# Patient Record
Sex: Female | Born: 1965 | Race: Black or African American | Hispanic: No | Smoking: Former smoker
Health system: Southern US, Community
[De-identification: ages and names within clinical notes are randomized; demographics above are authoritative.]

## PROBLEM LIST (undated history)

## (undated) DIAGNOSIS — M7989 Other specified soft tissue disorders: Secondary | ICD-10-CM

## (undated) DIAGNOSIS — C73 Malignant neoplasm of thyroid gland: Secondary | ICD-10-CM

## (undated) DIAGNOSIS — D649 Anemia, unspecified: Secondary | ICD-10-CM

## (undated) DIAGNOSIS — E079 Disorder of thyroid, unspecified: Secondary | ICD-10-CM

## (undated) DIAGNOSIS — N289 Disorder of kidney and ureter, unspecified: Secondary | ICD-10-CM

## (undated) DIAGNOSIS — Z9289 Personal history of other medical treatment: Secondary | ICD-10-CM

## (undated) DIAGNOSIS — I10 Essential (primary) hypertension: Secondary | ICD-10-CM

## (undated) DIAGNOSIS — K5790 Diverticulosis of intestine, part unspecified, without perforation or abscess without bleeding: Secondary | ICD-10-CM

## (undated) HISTORY — DX: Malignant neoplasm of thyroid gland: C73

## (undated) HISTORY — PX: COSMETIC SURGERY: SHX468

## (undated) HISTORY — DX: Disorder of thyroid, unspecified: E07.9

## (undated) HISTORY — DX: Personal history of other medical treatment: Z92.89

## (undated) HISTORY — DX: Diverticulosis of intestine, part unspecified, without perforation or abscess without bleeding: K57.90

## (undated) HISTORY — DX: Disorder of kidney and ureter, unspecified: N28.9

## (undated) HISTORY — DX: Anemia, unspecified: D64.9

## (undated) HISTORY — PX: REDUCTION MAMMAPLASTY: SUR839

---

## 1985-03-30 HISTORY — PX: CHOLECYSTECTOMY: SHX55

## 1997-03-30 HISTORY — PX: THYROIDECTOMY: SHX17

## 1997-12-28 ENCOUNTER — Emergency Department (HOSPITAL_COMMUNITY): Admission: EM | Admit: 1997-12-28 | Discharge: 1997-12-28 | Payer: Self-pay | Admitting: Emergency Medicine

## 1999-01-06 ENCOUNTER — Other Ambulatory Visit: Admission: RE | Admit: 1999-01-06 | Discharge: 1999-01-06 | Payer: Self-pay | Admitting: Endocrinology

## 1999-01-22 ENCOUNTER — Encounter: Payer: Self-pay | Admitting: Otolaryngology

## 1999-01-22 ENCOUNTER — Encounter: Admission: RE | Admit: 1999-01-22 | Discharge: 1999-01-22 | Payer: Self-pay | Admitting: Otolaryngology

## 1999-02-06 ENCOUNTER — Encounter (INDEPENDENT_AMBULATORY_CARE_PROVIDER_SITE_OTHER): Payer: Self-pay | Admitting: Specialist

## 1999-02-06 ENCOUNTER — Observation Stay (HOSPITAL_COMMUNITY): Admission: RE | Admit: 1999-02-06 | Discharge: 1999-02-07 | Payer: Self-pay | Admitting: Otolaryngology

## 1999-08-09 ENCOUNTER — Emergency Department (HOSPITAL_COMMUNITY): Admission: EM | Admit: 1999-08-09 | Discharge: 1999-08-09 | Payer: Self-pay | Admitting: Emergency Medicine

## 1999-08-09 ENCOUNTER — Encounter: Payer: Self-pay | Admitting: Emergency Medicine

## 2000-04-20 ENCOUNTER — Other Ambulatory Visit: Admission: RE | Admit: 2000-04-20 | Discharge: 2000-04-20 | Payer: Self-pay | Admitting: Obstetrics

## 2000-04-21 ENCOUNTER — Ambulatory Visit (HOSPITAL_COMMUNITY): Admission: RE | Admit: 2000-04-21 | Discharge: 2000-04-21 | Payer: Self-pay | Admitting: Obstetrics

## 2000-04-21 ENCOUNTER — Encounter: Payer: Self-pay | Admitting: Obstetrics

## 2000-08-07 ENCOUNTER — Inpatient Hospital Stay (HOSPITAL_COMMUNITY): Admission: AD | Admit: 2000-08-07 | Discharge: 2000-08-07 | Payer: Self-pay | Admitting: *Deleted

## 2000-08-08 ENCOUNTER — Inpatient Hospital Stay (HOSPITAL_COMMUNITY): Admission: AD | Admit: 2000-08-08 | Discharge: 2000-08-08 | Payer: Self-pay | Admitting: Obstetrics

## 2000-08-09 ENCOUNTER — Inpatient Hospital Stay (HOSPITAL_COMMUNITY): Admission: AD | Admit: 2000-08-09 | Discharge: 2000-08-12 | Payer: Self-pay | Admitting: Obstetrics

## 2000-08-09 ENCOUNTER — Encounter (INDEPENDENT_AMBULATORY_CARE_PROVIDER_SITE_OTHER): Payer: Self-pay | Admitting: Specialist

## 2004-01-02 ENCOUNTER — Ambulatory Visit (HOSPITAL_COMMUNITY): Admission: RE | Admit: 2004-01-02 | Discharge: 2004-01-02 | Payer: Self-pay | Admitting: Obstetrics

## 2004-07-24 ENCOUNTER — Emergency Department (HOSPITAL_COMMUNITY): Admission: EM | Admit: 2004-07-24 | Discharge: 2004-07-24 | Payer: Self-pay | Admitting: Family Medicine

## 2004-07-25 ENCOUNTER — Encounter: Admission: RE | Admit: 2004-07-25 | Discharge: 2004-07-25 | Payer: Self-pay | Admitting: Internal Medicine

## 2004-07-25 ENCOUNTER — Ambulatory Visit: Payer: Self-pay | Admitting: Internal Medicine

## 2004-08-01 ENCOUNTER — Ambulatory Visit: Payer: Self-pay | Admitting: Internal Medicine

## 2004-08-22 ENCOUNTER — Ambulatory Visit: Payer: Self-pay | Admitting: Internal Medicine

## 2004-08-29 ENCOUNTER — Ambulatory Visit: Payer: Self-pay | Admitting: Internal Medicine

## 2004-09-15 ENCOUNTER — Ambulatory Visit: Payer: Self-pay | Admitting: Internal Medicine

## 2004-09-24 ENCOUNTER — Ambulatory Visit: Payer: Self-pay | Admitting: Internal Medicine

## 2004-10-09 ENCOUNTER — Ambulatory Visit: Payer: Self-pay | Admitting: Internal Medicine

## 2004-12-16 ENCOUNTER — Ambulatory Visit: Payer: Self-pay | Admitting: Internal Medicine

## 2004-12-19 ENCOUNTER — Encounter: Admission: RE | Admit: 2004-12-19 | Discharge: 2004-12-19 | Payer: Self-pay | Admitting: Otolaryngology

## 2005-03-25 ENCOUNTER — Emergency Department (HOSPITAL_COMMUNITY): Admission: EM | Admit: 2005-03-25 | Discharge: 2005-03-25 | Payer: Self-pay | Admitting: Family Medicine

## 2006-12-17 DIAGNOSIS — Z8719 Personal history of other diseases of the digestive system: Secondary | ICD-10-CM | POA: Insufficient documentation

## 2007-05-06 ENCOUNTER — Ambulatory Visit: Payer: Self-pay | Admitting: Internal Medicine

## 2007-05-06 ENCOUNTER — Telehealth: Payer: Self-pay | Admitting: Internal Medicine

## 2007-05-06 DIAGNOSIS — R42 Dizziness and giddiness: Secondary | ICD-10-CM

## 2007-05-06 DIAGNOSIS — N920 Excessive and frequent menstruation with regular cycle: Secondary | ICD-10-CM

## 2007-05-06 DIAGNOSIS — D649 Anemia, unspecified: Secondary | ICD-10-CM

## 2007-05-06 LAB — CONVERTED CEMR LAB: Hemoglobin: 9.9 g/dL

## 2007-05-23 ENCOUNTER — Ambulatory Visit (HOSPITAL_COMMUNITY): Admission: RE | Admit: 2007-05-23 | Discharge: 2007-05-23 | Payer: Self-pay | Admitting: Obstetrics

## 2007-05-23 ENCOUNTER — Telehealth: Payer: Self-pay | Admitting: Internal Medicine

## 2007-06-17 ENCOUNTER — Ambulatory Visit: Payer: Self-pay | Admitting: Internal Medicine

## 2007-06-17 LAB — CONVERTED CEMR LAB
Bilirubin Urine: NEGATIVE
Glucose, Urine, Semiquant: NEGATIVE
WBC Urine, dipstick: NEGATIVE

## 2007-06-20 LAB — CONVERTED CEMR LAB
ALT: 17 units/L (ref 0–35)
BUN: 8 mg/dL (ref 6–23)
Basophils Relative: 0.6 % (ref 0.0–1.0)
Bilirubin, Direct: 0.1 mg/dL (ref 0.0–0.3)
Chloride: 103 meq/L (ref 96–112)
Eosinophils Absolute: 0.1 10*3/uL (ref 0.0–0.6)
GFR calc Af Amer: 117 mL/min
HCT: 36.1 % (ref 36.0–46.0)
Hemoglobin: 11.6 g/dL — ABNORMAL LOW (ref 12.0–15.0)
LDL Cholesterol: 116 mg/dL — ABNORMAL HIGH (ref 0–99)
MCHC: 32.4 g/dL (ref 30.0–36.0)
Monocytes Absolute: 0.5 10*3/uL (ref 0.2–0.7)
Neutro Abs: 3.8 10*3/uL (ref 1.4–7.7)
Neutrophils Relative %: 67.7 % (ref 43.0–77.0)
Platelets: 321 10*3/uL (ref 150–400)
Potassium: 4.6 meq/L (ref 3.5–5.1)
RBC: 4.46 M/uL (ref 3.87–5.11)
RDW: 21.3 % — ABNORMAL HIGH (ref 11.5–14.6)
Total CHOL/HDL Ratio: 3.2
Triglycerides: 27 mg/dL (ref 0–149)

## 2007-06-24 ENCOUNTER — Ambulatory Visit: Payer: Self-pay | Admitting: Internal Medicine

## 2007-06-24 DIAGNOSIS — M5430 Sciatica, unspecified side: Secondary | ICD-10-CM

## 2007-08-23 ENCOUNTER — Ambulatory Visit: Payer: Self-pay | Admitting: Internal Medicine

## 2007-09-16 ENCOUNTER — Telehealth: Payer: Self-pay | Admitting: Internal Medicine

## 2007-11-18 ENCOUNTER — Telehealth: Payer: Self-pay | Admitting: Internal Medicine

## 2009-03-30 HISTORY — PX: OTHER SURGICAL HISTORY: SHX169

## 2009-07-09 ENCOUNTER — Ambulatory Visit: Payer: Self-pay | Admitting: Internal Medicine

## 2009-09-05 ENCOUNTER — Encounter (INDEPENDENT_AMBULATORY_CARE_PROVIDER_SITE_OTHER): Payer: Self-pay | Admitting: *Deleted

## 2009-09-13 ENCOUNTER — Emergency Department (HOSPITAL_BASED_OUTPATIENT_CLINIC_OR_DEPARTMENT_OTHER): Admission: EM | Admit: 2009-09-13 | Discharge: 2009-09-14 | Payer: Self-pay | Admitting: Emergency Medicine

## 2009-09-14 ENCOUNTER — Ambulatory Visit: Payer: Self-pay | Admitting: Radiology

## 2009-09-16 ENCOUNTER — Ambulatory Visit: Payer: Self-pay | Admitting: Internal Medicine

## 2009-09-16 DIAGNOSIS — R9389 Abnormal findings on diagnostic imaging of other specified body structures: Secondary | ICD-10-CM | POA: Insufficient documentation

## 2009-09-16 DIAGNOSIS — K5730 Diverticulosis of large intestine without perforation or abscess without bleeding: Secondary | ICD-10-CM | POA: Insufficient documentation

## 2009-09-16 DIAGNOSIS — R319 Hematuria, unspecified: Secondary | ICD-10-CM | POA: Insufficient documentation

## 2009-09-16 LAB — CONVERTED CEMR LAB
Ketones, urine, test strip: NEGATIVE
Specific Gravity, Urine: 1.01
Urobilinogen, UA: 0.2
WBC Urine, dipstick: NEGATIVE

## 2009-09-19 ENCOUNTER — Encounter: Admission: RE | Admit: 2009-09-19 | Discharge: 2009-09-19 | Payer: Self-pay | Admitting: Internal Medicine

## 2009-09-23 ENCOUNTER — Telehealth: Payer: Self-pay | Admitting: Internal Medicine

## 2009-09-25 ENCOUNTER — Ambulatory Visit: Payer: Self-pay | Admitting: Internal Medicine

## 2009-09-25 LAB — CONVERTED CEMR LAB
Glucose, Urine, Semiquant: NEGATIVE
Specific Gravity, Urine: >=1.03
pH: 5

## 2009-09-27 LAB — CONVERTED CEMR LAB
ALT: 21 units/L (ref 0–35)
Albumin: 4.1 g/dL (ref 3.5–5.2)
Alkaline Phosphatase: 57 units/L (ref 39–117)
Basophils Absolute: 0 10*3/uL (ref 0.0–0.1)
Calcium: 9.5 mg/dL (ref 8.4–10.5)
Cholesterol: 175 mg/dL (ref 0–200)
Creatinine, Ser: 0.7 mg/dL (ref 0.4–1.2)
Eosinophils Absolute: 0.2 10*3/uL (ref 0.0–0.7)
Glucose, Bld: 88 mg/dL (ref 70–99)
MCHC: 32.3 g/dL (ref 30.0–36.0)
Potassium: 5.1 meq/L (ref 3.5–5.1)
RBC: 4.2 M/uL (ref 3.87–5.11)
RDW: 20.9 % — ABNORMAL HIGH (ref 11.5–14.6)
Sodium: 142 meq/L (ref 135–145)
Total Bilirubin: 0.4 mg/dL (ref 0.3–1.2)
Total CHOL/HDL Ratio: 4
Triglycerides: 52 mg/dL (ref 0.0–149.0)
VLDL: 10.4 mg/dL (ref 0.0–40.0)

## 2009-10-08 ENCOUNTER — Ambulatory Visit: Payer: Self-pay | Admitting: Internal Medicine

## 2009-10-08 ENCOUNTER — Other Ambulatory Visit: Admission: RE | Admit: 2009-10-08 | Discharge: 2009-10-08 | Payer: Self-pay | Admitting: Internal Medicine

## 2009-10-08 DIAGNOSIS — E669 Obesity, unspecified: Secondary | ICD-10-CM

## 2009-10-08 LAB — CONVERTED CEMR LAB: Hemoglobin: 10.5 g/dL

## 2009-10-18 ENCOUNTER — Encounter: Payer: Self-pay | Admitting: Internal Medicine

## 2009-11-05 ENCOUNTER — Encounter: Payer: Self-pay | Admitting: Internal Medicine

## 2009-11-05 ENCOUNTER — Ambulatory Visit: Payer: Self-pay | Admitting: Family Medicine

## 2009-11-05 ENCOUNTER — Ambulatory Visit (HOSPITAL_COMMUNITY)
Admission: RE | Admit: 2009-11-05 | Discharge: 2009-11-05 | Payer: Self-pay | Source: Home / Self Care | Admitting: Urology

## 2009-11-05 DIAGNOSIS — M7710 Lateral epicondylitis, unspecified elbow: Secondary | ICD-10-CM | POA: Insufficient documentation

## 2009-11-20 ENCOUNTER — Ambulatory Visit: Payer: Self-pay | Admitting: Internal Medicine

## 2009-12-12 ENCOUNTER — Inpatient Hospital Stay (HOSPITAL_COMMUNITY)
Admission: RE | Admit: 2009-12-12 | Discharge: 2009-12-14 | Payer: Self-pay | Source: Home / Self Care | Admitting: Urology

## 2009-12-12 ENCOUNTER — Encounter (INDEPENDENT_AMBULATORY_CARE_PROVIDER_SITE_OTHER): Payer: Self-pay | Admitting: Urology

## 2010-01-07 ENCOUNTER — Encounter: Payer: Self-pay | Admitting: Internal Medicine

## 2010-02-25 ENCOUNTER — Ambulatory Visit: Payer: Self-pay | Admitting: Internal Medicine

## 2010-02-25 LAB — CONVERTED CEMR LAB: Hemoglobin: 11.1 g/dL

## 2010-03-30 HISTORY — PX: OTHER SURGICAL HISTORY: SHX169

## 2010-03-30 HISTORY — PX: BREAST REDUCTION SURGERY: SHX8

## 2010-04-19 ENCOUNTER — Encounter: Payer: Self-pay | Admitting: Internal Medicine

## 2010-04-29 NOTE — Letter (Signed)
Summary: [facility]  [facility]   Imported By: Maryln Gottron 01/14/2010 12:44:52  _____________________________________________________________________  External Attachment:    Type:   Image     Comment:   External Document

## 2010-04-29 NOTE — Letter (Signed)
Summary: Colonoscopy Date Change Letter  [facility]  [address] [place], [place] [address]   Phone: [phone]  Fax: [phone]      September 05, 2009 MRN: [id number]   [facility] [address] [place], [place]  [address]   Dear Ms. Misty Moran,   Previously you were recommended to have a repeat colonoscopy around this time. Your chart was recently reviewed by Dr.PERRY of [facility]. Follow up colonoscopy is now recommended in JUNE,2016. This revised recommendation is based on current, nationally recognized guidelines for colorectal cancer screening and polyp surveillance. These guidelines are endorsed by the American Cancer Society, [employer] on Colorectal Cancer as well as numerous other major medical organizations.  Please understand that our recommendation assumes that you do not have any new symptoms such as bleeding, a change in bowel habits, anemia, or significant abdominal discomfort. If you do have any concerning GI symptoms or want to discuss the guideline recommendations, please call to arrange an office visit at your earliest convenience. Otherwise we will keep you in our reminder system and contact you 1-2 months prior to the date listed above to schedule your next colonoscopy.  Thank you,  Misty Moran. Marina Goodell, M.D.  [facility] [phone]

## 2010-04-29 NOTE — Assessment & Plan Note (Signed)
Summary: R ARM PAIN // RS   Vital Signs:  Patient profile:   45 year old female Menstrual status:  regular Height:      66.5 inches (168.91 cm) Weight:      236 pounds (107.27 kg) O2 Sat:      97 % on Room air Temp:     98.5 degrees F (36.94 degrees C) oral Pulse rate:   100 / minute BP sitting:   120 / 88  (left arm) Cuff size:   large  Vitals Entered By: Josph Macho RMA (November 05, 2009 3:23 PM)  O2 Flow:  Room air CC: Right arm pain in elbow X2 weeks/ pt states she would like a referral/ CF Is Patient Diabetic? No   History of Present Illness: Patient in today for right elbow pain. The pain started about 2 weeks ago after she lifted a heavy suitcase. She has never had a similar injury. Heavy lifting and internal and external rotation at elbow make it worse. She felt as if it was swollen/red and hot earlier in the week. The redness and heat have resolved but it still feels somewhat swollen. the radiate slightly down the arm but not all the way to the hand. No numbness/tingling or weakness in the hand. She does work on [employer] but has not had any recent difficulties at work. With further questioning she has noted some mild stiffness in her neck on the left side recently. No HA/f/c/CP/palp/GI c/o.  Current Medications (verified): 1)  Multivitamins   Tabs (Multiple Vitamin) .... Once Daily 2)  Testosterone Implant  Allergies (verified): 1)  ! Ciprofloxacin Hcl (Ciprofloxacin Hcl) 2)  ! * Peanuts  Past History:  Past medical history reviewed for relevance to current acute and chronic problems. Social history (including risk factors) reviewed for relevance to current acute and chronic problems.  Past Medical History: Reviewed history from 10/08/2009 and no changes required. Diverticulitis, hx of   5-6 years ago. G3P2 Anemia    ? from periods on iron a long time.   lesion on kidney MRI  right BOsniak type 3  under eval  Social History: Reviewed history from 10/08/2009  and no changes required. Divorced  child at home hh of 3  Former Smoker Alcohol use-yes rare Drug use-no Regular exercise-yes  Review of Systems      See HPI  Physical Exam  General:  Well-developed,well-nourished,in no acute distress; alert,appropriate and cooperative throughout examination Head:  Normocephalic and atraumatic without obvious abnormalities. No apparent alopecia or balding. Neck:  No deformities, masses, or tenderness noted. Lungs:  Normal respiratory effort, chest expands symmetrically. Lungs are clear to auscultation, no crackles or wheezes. Heart:  Normal rate and regular rhythm. S1 and S2 normal without gallop, murmur, click, rub or other extra sounds. Abdomen:  Bowel sounds positive,abdomen soft and non-tender without masses, organomegaly or hernias noted. Extremities:  No clubbing, cyanosis, or deformity noted. slight swelling noted over lateral epicondyl, no warmth or erythema. mild tender with palpation and int/ext rotation. Neurologic:  No cranial nerve deficits noted. Station and gait are normal. Plantar reflexes are down-going bilaterally. DTRs are symmetrical throughout. Sensory, motor and coordinative functions appear intact. Psych:  Cognition and judgment appear intact. Alert and cooperative with normal attention span and concentration. No apparent delusions, illusions, hallucinations   Impression & Recommendations:  Problem # 1:  LATERAL EPICONDYLITIS, RIGHT (ICD-726.32) Apply ice two times a day and take Naproxen 375mg  by mouth two times a day as needed pain with  food.   Complete Medication List: 1)  Multivitamins Tabs (Multiple vitamin) .... Once daily 2)  Testosterone Implant  3)  Naproxen 375 Mg Tabs (Naproxen) .Marland Kitchen.. 1 tab by mouth two times a day as needed pain with food  Patient Instructions: 1)  Please schedule a follow-up appointment as needed if symptoms worsen or do not improve. 2)  Use Naproxen and ice to the left elbow two times a day if  inadequate relief may use tylenol for breakthrough pain 3)  Take 650 - 1000 mg of tylenol every 4-6 hours as needed for relief of pain or comfort of fever. Avoid taking more than 3000 mg in a 24 hour period( can cause liver damage in higher doses).  Prescriptions: NAPROXEN 375 MG TABS (NAPROXEN) 1 tab by mouth two times a day as needed pain with food  #60 x 1   Entered and Authorized by:   Danise Edge MD   Signed by:   Danise Edge MD on 11/05/2009   Method used:   Electronically to        [facility] [address] [id number] (retail)       [address]       [place], [place]  [address]       Ph: [phone]       Fax: [phone]   RxID:   [id number]

## 2010-04-29 NOTE — Assessment & Plan Note (Signed)
Summary: cracked toe/?inf/cjr   Vital Signs:  Patient profile:   45 year old female Menstrual status:  regular LMP:     06/17/2009 Height:      66.5 inches Weight:      241 pounds BMI:     38.45 Temp:     98.9 degrees F oral Pulse rate:   72 / minute BP sitting:   140 / 84  (left arm) Cuff size:   large  Vitals Entered By: Romualdo Bolk, CMA Duncan Dull) (July 09, 2009 9:48 AM) CC: Hit Ring toe on a door on 07/08/09. Nail is split down the middle. Pt is in pain when she thinks about it and looks at it. She is more concerned about infection and losing the nail. LMP (date): 06/17/2009 LMP - Character: heavy Menarche (age onset years): 7   Menses interval (days): 28 Menstrual flow (days): 7 Menstrual Status regular Enter LMP: 06/17/2009   History of Present Illness: Misty Moran comes in today for aboe when toe right 4th hit screen door . Pain when touches but not otherwise. Unsure what to  do. Supposed to  fo on a trip with lots of walking and asks what to do.  No weeping or previous injury.   Preventive Screening-Counseling & Management  Alcohol-Tobacco     Alcohol drinks/day: <1     Alcohol type: mixed drink     Smoking Status: quit     Year Quit: 1991  Caffeine-Diet-Exercise     Does Patient Exercise: yes     Type of exercise: T-tapp     Times/week: 2  Current Medications (verified): 1)  Multivitamins   Tabs (Multiple Vitamin) .... Once Daily 2)  Tandem Plus 162-115.2-1 Mg  Caps (Fefum-Fepo-Fa-B Cmp-C-Zn-Mn-Cu) .Marland Kitchen.. 1 By Mouth Once Daily  Allergies (verified): 1)  ! Ciprofloxacin Hcl (Ciprofloxacin Hcl) 2)  ! * Peanuts  Past History:  Past medical, surgical, family and social histories (including risk factors) reviewed for relevance to current acute and chronic problems.  Past Medical History: Reviewed history from 06/24/2007 and no changes required. Diverticulitis, hx of G3P2  Past Surgical History: Reviewed history from 06/24/2007 and no changes  required. CB C-setion G3 P2 Cholecystectomy Thyroidectomy  thyroid ca Colonoscopy 6/06  Past History:  Care Management: None Current  Family History: Reviewed history from 06/24/2007 and no changes required. cva gps ht gm dm gr aunt Mom early chf smoker    Social History: Reviewed history from 08/23/2007 and no changes required. Divorced  child at home Former Smoker Alcohol use-yes rare Drug use-no Regular exercise-yes  Physical Exam  General:  Well-developed,well-nourished,in no acute distress; alert,appropriate and cooperative throughout examination Msk:  no joint swelling and no joint warmth.  toenails painted  right 4th toes with irreg split nail but not elevated and small amt of blood below nail  no tenderness  on palpation. oozing or swelling or joint.   rest of foot nl except bandaid on pinky toe. Pulses:  pulses intact without delay   Extremities:  no clubbing cyanosis or edema  Psych:  Oriented X3, good eye contact, not anxious appearing, and not depressed appearing.     Impression & Recommendations:  Problem # 1:  TOE INJURY (ICD-959.7) Assessment New involves nail but no nailbed ? involvement and seems intact and no infection or joint involvement.  local care and observation for infection . etc.   Complete Medication List: 1)  Multivitamins Tabs (Multiple vitamin) .... Once daily 2)  Tandem Plus 162-115.2-1 Mg Caps (  Fefum-fepo-fa-b cmp-c-zn-mn-cu) .Marland Kitchen.. 1 by mouth once daily  Patient Instructions: 1)  soak toe warm water 3 x per day and then antibiotic ointment  2)  Keep coved to prevent further trauma   . 3)  will take a while for the nail to grow out.   4)  Call if get signs of infection

## 2010-04-29 NOTE — Assessment & Plan Note (Signed)
Summary: DISCUSS MEDS / RS   Vital Signs:  Patient profile:   45 year old female Menstrual status:  regular LMP:     02/08/2010 Weight:      230 pounds Pulse rate:   72 / minute BP sitting:   130 / 70  (right arm) Cuff size:   large  Vitals Entered By: Romualdo Bolk, CMA Duncan Dull) (February 25, 2010 2:06 PM) CC: Discuss medications- Pt wants to go on a weight loss drug like phenteramine LMP (date): 02/08/2010 LMP - Character: heavy Menarche (age onset years): 7   Menses interval (days): 28 Menstrual flow (days): 7 Enter LMP: 02/08/2010 Last PAP Result NEGATIVE FOR INTRAEPITHELIAL LESIONS OR MALIGNANCY.   History of Present Illness: Misty Moran comes in today   for a couple of issiues . Since her last visit she had  #Surgery   on kidney Sept 15 and  fortunately was benign . #"Iron levels" were normal   still on iron pill 1 point below   .    #Still taking homone levels  testosterone and progesterone  per implant and levels  .  Marland Kitchen  #HAd lost weight before hospitalization and then went on a  . cruise and   and had extra calories   and gained weight back .   She  is a stress eater  and then gains weight back.  cutting.  out  much recently the describes having difficulty getting her weight down. She requests diet pills. She is aware that that is not a solution as she has lost weight before. She denies chest pain shortness of breath hypertension.      Preventive Screening-Counseling & Management  Alcohol-Tobacco     Alcohol drinks/day: <1     Alcohol type: mixed drink     Smoking Status: quit     Year Quit: 1991  Caffeine-Diet-Exercise     Caffeine use/day: 1     Does Patient Exercise: yes     Type of exercise: T-tapp     Times/week: 2  Current Medications (verified): 1)  Multivitamins   Tabs (Multiple Vitamin) .... Once Daily 2)  Testosterone Implant 3)  Naproxen 375 Mg Tabs (Naproxen) .Marland Kitchen.. 1 Tab By Mouth Two Times A Day As Needed Pain With Food 4)  Progesterone   Powd (Progesterone) .... 50mg  A Day  Allergies (verified): 1)  ! Ciprofloxacin Hcl (Ciprofloxacin Hcl) 2)  ! * Peanuts  Past History:  Past medical, surgical, family and social histories (including risk factors) reviewed, and no changes noted (except as noted below).  Past Medical History: Reviewed history from 11/20/2009 and no changes required. Diverticulitis, hx of   5-6 years ago. G3P2 Anemia    ? from periods on iron a long time.   lesion on kidney MRI  right BOsniak type 3  under eval to have sugery to remove   Past Surgical History: CB C-section G3 P2  Cholecystectomy Thyroidectomy  thyroid ca Colonoscopy 6/06 removal of renal lesion benign 2011  Past History:  Care Management: GI :  Marina Goodell Gynecology: Gaynell Face.  [facility]  [address]   phone [phone] hormone replacement Urology :  Family History: Reviewed history from 06/24/2007 and no changes required. cva gps ht gm dm gr aunt Mom early chf smoker    Social History: Reviewed history from 10/08/2009 and no changes required. Divorced  child at home hh of 3  Former Smoker Alcohol use-yes rare Drug use-no Regular exercise-yes  Review of Systems  The  patient denies anorexia, fever, weight loss, vision loss, decreased hearing, chest pain, syncope, prolonged cough, abdominal pain, melena, hematochezia, severe indigestion/heartburn, transient blindness, difficulty walking, depression, abnormal bleeding, enlarged lymph nodes, angioedema, and breast masses.    Physical Exam  General:  Well-developed,well-nourished,in no acute distress; alert,appropriate and cooperative throughout examination Head:  normocephalic and atraumatic.   Psych:  Oriented X3, memory intact for recent and remote, good eye contact, not anxious appearing, and not depressed appearing.     Impression & Recommendations:  Problem # 1:  OBESITY (ICD-278.00)  most recently she has had surgery holiday and a cruise which of affected her  weight gain. These would best be addressed with lifestyle intervention.  we had a very lengthy discussion about use of anorectic medications which I don't usually use because they are only indicated were short term and do not encourage lifestyle change. I've seen more patients depressed in rebounding having used these medications. However she's aware of all these limitations.  She has no contraindication to this medicine. However I discussed that she would need to do more lifestyle changes such as not eating out and come back in a month which he agreed to. Ht: 66.5 (11/05/2009)   Wt: 230 (02/25/2010)   BMI: 38.45 (07/09/2009)  Problem # 2:  NONSPECIFIC ABN FINDING RAD & OTH EXAM GU ORGAN (ICD-793.5) removal showed benign lesion  Problem # 3:  UNSPECIFIED ANEMIA (ICD-285.9) Assessment: Improved improved  was 10.2 in JUne  Orders: Hgb (85018) Fingerstick [phone])  Hgb: 11.1 (02/25/2010)   Hct: 29.8 (09/25/2009)   Platelets: 354.0 (09/25/2009) RBC: 4.20 (09/25/2009)   RDW: 20.9 (09/25/2009)   WBC: 6.4 (09/25/2009) MCV: 70.8 (09/25/2009)   MCHC: 32.3 (09/25/2009) TSH: 1.40 (09/25/2009)  Complete Medication List: 1)  Multivitamins Tabs (Multiple vitamin) .... Once daily 2)  Testosterone Implant  3)  Naproxen 375 Mg Tabs (Naproxen) .Marland Kitchen.. 1 tab by mouth two times a day as needed pain with food 4)  Progesterone Powd (Progesterone) .... 50mg  a day 5)  Phentermine Hcl 37.5 Mg Caps (Phentermine hcl) .Marland Kitchen.. 1 by mouth once daily  Patient Instructions: 1)  No eating out for a  month . 2)  record   intake and exercise .  for at least 2 weeks .   3)  You need to use up 3500 calories more than intake to lose one pound of body weight.  4)  return office visit in a month or as needed.  Prescriptions: PHENTERMINE HCL 37.5 MG CAPS (PHENTERMINE HCL) 1 by mouth once daily  #30 x 0   Entered and Authorized by:   Madelin Headings MD   Signed by:   Madelin Headings MD on 02/25/2010   Method used:   Print then Give  to Patient   RxID:   [id number]    Orders Added: 1)  Hgb [85018] 2)  Fingerstick [36416] 3)  Est. Patient Level IV [99214]    Laboratory Results   CBC   HGB:  11.1 g/dL   (Normal Range: 95.6-21.3 in Males, 12.0-15.0 in Females) Comments: Rita Ohara  February 25, 2010 2:54 PM

## 2010-04-29 NOTE — Progress Notes (Signed)
Summary: URO referral  Phone Note Call from Patient   Caller: Patient Call For: Madelin Headings MD Summary of Call: Pt is calling about URO referral.  Please call. [phone] Initial call taken by: Lynann Beaver CMA,  September 23, 2009 1:41 PM  Follow-up for Phone Call        Faxed order to [facility] on 6/27 - they will contact pt directly to set up appt.   I called and left message for pt. Follow-up by: Corky Mull,  September 23, 2009 1:45 PM

## 2010-04-29 NOTE — Assessment & Plan Note (Signed)
Summary: cpx/pap/njr   Vital Signs:  Patient profile:   45 year old female Menstrual status:  regular LMP:     10/02/2009 Height:      66.5 inches Weight:      237 pounds Pulse rate:   88 / minute BP sitting:   120 / 80  (right arm) Cuff size:   large  Vitals Entered By: Romualdo Bolk, CMA (AAMA) (October 08, 2009 1:19 PM) CC: cpx with pap LMP (date): 10/02/2009 LMP - Character: heavy Menarche (age onset years): 7   Menses interval (days): 28 Menstrual flow (days): 7 Enter LMP: 10/02/2009   History of Present Illness: Misty Moran comes in today  for preventive visit  Since last visit  here  there have been no major changes in health status    testosterone.  implants through clinic in town for fatigue . / if helping. HAs appt with urology in a week or so about the lesion on the MRI . Feels well but somewhat tired at times. No unusual bleeding   currently .    Preventive Care Screening  Mammogram:    Date:  03/31/2007    Results:  normal    Preventive Screening-Counseling & Management  Alcohol-Tobacco     Alcohol drinks/day: <1     Alcohol type: mixed drink     Smoking Status: quit     Year Quit: 1991  Caffeine-Diet-Exercise     Caffeine use/day: 1     Does Patient Exercise: yes     Type of exercise: T-tapp     Times/week: 2  Hep-HIV-STD-Contraception     Dental Visit-last 6 months no     Dental Care Counseling: to seek dental care; no dental care within six months  Safety-Violence-Falls     Seat Belt Use: yes     Firearms in the Home: no firearms in the home     Smoke Detectors: yes      Blood Transfusions:  no.    Current Medications (verified): 1)  Multivitamins   Tabs (Multiple Vitamin) .... Once Daily 2)  Testosterone Implant 3)  Zofran Odt 8 Mg Tbdp (Ondansetron) 4)  Dilaudid 4 Mg Tabs (Hydromorphone Hcl)  Allergies (verified): 1)  ! Ciprofloxacin Hcl (Ciprofloxacin Hcl) 2)  ! * Peanuts  Past History:  Past medical, surgical,  family and social histories (including risk factors) reviewed, and no changes noted (except as noted below).  Past Medical History: Diverticulitis, hx of   5-6 years ago. G3P2 Anemia    ? from periods on iron a long time.   lesion on kidney MRI  right BOsniak type 3  under eval  Past Surgical History: Reviewed history from 09/16/2009 and no changes required. CB C-section G3 P2  Cholecystectomy Thyroidectomy  thyroid ca Colonoscopy 6/06  Past History:  Care Management: None Current GI :  Marina Goodell Gynecology: Gaynell Face.  [facility]  [address]   phone [phone] hormone replacement  Family History: Reviewed history from 06/24/2007 and no changes required. cva gps ht gm dm gr aunt Mom early chf smoker    Social History: Reviewed history from 08/23/2007 and no changes required. Divorced  child at home hh of 3  Former Smoker Alcohol use-yes rare Drug use-no Regular Research scientist (life sciences) Use:  yes Dental Care w/in 6 mos.:  no Blood Transfusions:  no  Review of Systems  The patient denies anorexia, fever, weight loss, weight gain, vision loss, decreased hearing, hoarseness, chest pain, syncope, dyspnea on exertion, peripheral  edema, prolonged cough, headaches, hemoptysis, abdominal pain, melena, hematochezia, severe indigestion/heartburn, incontinence, difficulty walking, enlarged lymph nodes, and angioedema.          Periods  5 - 5.5 days heavy.    Physical Exam General Appearance: well developed, well nourished, no acute distress Eyes: conjunctiva and lids normal, PERRLA, EOMI,  WNL Ears, Nose, Mouth, Throat: TM clear, nares clear, oral exam WNL Neck: supple, no lymphadenopathy, no thyromegaly, no JVD Respiratory: clear to auscultation and percussion, respiratory effort normal Cardiovascular: regular rate and rhythm, S1-S2, no murmur, rub or gallop, no bruits, peripheral pulses normal and symmetric, no cyanosis, clubbing, edema or varicosities Chest: no scars, masses,  tenderness; no asymmetry, skin changes, nipple discharge   Gastrointestinal: soft, non-tender; no hepatosplenomegaly, no obv masses; active bowel sounds all quadrants, guaiac negative stool; no masses, tenderness, hemorrhoids  Genitourinary: no vaginal discharge, lesions; no obvious masses or tenderness   pap done  Lymphatic: no cervical, axillary or inguinal adenopathy Musculoskeletal: gait normal, muscle tone and strength WNL, no joint swelling, effusions, discoloration, crepitus  Skin: clear, good turgor, color WNL, no rashes, lesions, or ulcerations Neurologic: normal mental status, normal reflexes, normal strength, sensation, and motion Psychiatric: alert; oriented to person, place and time Other Exam:  see labs       Impression & Recommendations:  Problem # 1:  HEALTH MAINTENANCE EXAM (ICD-V70.0) Discussed nutrition,exercise,diet,healthy weight, vitamin D and calcium.   Problem # 2:  ROUTINE GYNECOLOGICAL EXAM (ICD-V72.31)  pap done   Orders: Pap Smear, Thin Prep ( Collection of) (Z6109)  Problem # 3:  NONSPECIFIC ABN FINDING RAD & OTH EXAM GU ORGAN (ICD-793.5) bosnial 3 to be evaluated  by urology   Problem # 4:  UNSPECIFIED ANEMIA (ICD-285.9) Assessment: Improved  9.6  and better at this .fu    .Marland KitchenMarland KitchenMarland KitchenMarland Kitchen  presumed iron defic    Expectant management and follow up   Hgb: 10.5 (10/08/2009)   Hct: 29.8 (09/25/2009)   Platelets: 354.0 (09/25/2009) RBC: 4.20 (09/25/2009)   RDW: 20.9 (09/25/2009)   WBC: 6.4 (09/25/2009) MCV: 70.8 (09/25/2009)   MCHC: 32.3 (09/25/2009) TSH: 1.40 (09/25/2009)  Orders: Hgb (85018) Fingerstick (60454)  Problem # 5:  OBESITY (ICD-278.00) counseled  Ht: 66.5 (10/08/2009)   Wt: 237 (10/08/2009)   BMI: 38.45 (07/09/2009)  Complete Medication List: 1)  Multivitamins Tabs (Multiple vitamin) .... Once daily 2)  Testosterone Implant  3)  Zofran Odt 8 Mg Tbdp (Ondansetron) 4)  Dilaudid 4 Mg Tabs (Hydromorphone hcl)  Other Orders: Tdap => 20yrs IM  (09811) Admin 1st Vaccine (91478)  Patient Instructions: 1)  continue to take iron two times a day better . 2)  Lose weight to help your future health . consider weight watchers. 3)  Your hg is 10.2 today  4)  rov in 2 months so we can recheck your anemia   Immunizations Administered:  Tetanus Vaccine:    Vaccine Type: Tdap    Site: right deltoid    Mfr: GlaxoSmithKline    Dose: 0.5 ml    Route: IM    Given by: Romualdo Bolk, CMA (AAMA)    Exp. Date: 04/18/2011    Lot #: [id number]  Laboratory Results   CBC   HGB:  10.5 g/dL   (Normal Range: 57.8-46.9 in Males, 12.0-15.0 in Females) Comments: Rita Ohara  October 08, 2009 2:03 PM

## 2010-04-29 NOTE — Letter (Signed)
Summary: [facility]  [facility]   Imported By: Maryln Gottron 10/23/2009 14:31:35  _____________________________________________________________________  External Attachment:    Type:   Image     Comment:   External Document

## 2010-04-29 NOTE — Letter (Signed)
Summary: [facility]  [facility]   Imported By: Maryln Gottron 11/19/2009 13:16:12  _____________________________________________________________________  External Attachment:    Type:   Image     Comment:   External Document

## 2010-04-29 NOTE — Assessment & Plan Note (Signed)
Summary: still having arm pain//ccm   Vital Signs:  Patient profile:   45 year old female Menstrual status:  regular LMP:     10/27/2009 Weight:      227 pounds Pulse rate:   66 / minute BP sitting:   100 / 70  (left arm) Cuff size:   large  Vitals Entered By: Romualdo Bolk, CMA Duncan Dull) (November 20, 2009 2:16 PM) CC: Rt elbow pain that started around July 26th. Pt has saw Dr. Abner Greenspan for this. Naproxen helps with pain some but it is not going away. LMP (date): 10/27/2009 LMP - Character: heavy Menarche (age onset years): 7   Menses interval (days): 28 Menstrual flow (days): 7 Enter LMP: 10/27/2009 Last PAP Result NEGATIVE FOR INTRAEPITHELIAL LESIONS OR MALIGNANCY.   History of Present Illness: Misty Moran comes in today  for follow up of  elbow pain  from a few weeks ago.  naproxyn helps the pain about 30 %  but still difficult using arm and elbow and  ADLS .   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      No fall.   no numbness .  Update on renal lesion  to have it removed on septmenber 15 .   Preventive Screening-Counseling & Management  Alcohol-Tobacco     Alcohol drinks/day: <1     Alcohol type: mixed drink     Smoking Status: quit     Year Quit: 1991  Caffeine-Diet-Exercise     Caffeine use/day: 1     Does Patient Exercise: yes     Type of exercise: T-tapp     Times/week: 2  Current Medications (verified): 1)  Multivitamins   Tabs (Multiple Vitamin) .... Once Daily 2)  Testosterone Implant 3)  Naproxen 375 Mg Tabs (Naproxen) .Marland Kitchen.. 1 Tab By Mouth Two Times A Day As Needed Pain With Food  Allergies (verified): 1)  ! Ciprofloxacin Hcl (Ciprofloxacin Hcl) 2)  ! * Peanuts  Past History:  Past medical, surgical, family and social histories (including risk factors) reviewed for relevance to current acute and chronic problems.  Past Medical History: Diverticulitis, hx of   5-6 years ago. G3P2 Anemia    ? from periods on iron a long time.   lesion on  kidney MRI  right BOsniak type 3  under eval to have sugery to remove   Past Surgical History: Reviewed history from 09/16/2009 and no changes required. CB C-section G3 P2  Cholecystectomy Thyroidectomy  thyroid ca Colonoscopy 6/06  Past History:  Care Management: GI :  Marina Goodell Gynecology: Gaynell Face.  [facility]  [address]   phone [phone] hormone replacement Urology :  Family History: Reviewed history from 06/24/2007 and no changes required. cva gps ht gm dm gr aunt Mom early chf smoker    Social History: Reviewed history from 10/08/2009 and no changes required. Divorced  child at home hh of 3  Former Smoker Alcohol use-yes rare Drug use-no Regular exercise-yes  Review of Systems       as per hpi otherwise no change  Physical Exam  General:  alert, well-developed, and well-nourished.   Msk:  right elbow  lateral epicondylar pain.  no effusion  rom some pain with full extension.  grip ok  no warmth and no redness  Pulses:  pulses intact without delay   Neurologic:  grossly normal Skin:  turgor normal and color normal.   Psych:  Oriented X3, good eye contact, not anxious appearing, and  not depressed appearing.     Impression & Recommendations:  Problem # 1:  LATERAL EPICONDYLITIS, RIGHT (ICD-726.32) not improved in dominant  arm  still seems like epicondylitis  but    some decrease ROM and  interfereing with many activities of life.  rec ortho consult   Orders: Orthopedic Referral (Ortho)  Problem # 2:  NONSPECIFIC ABN FINDING RAD & OTH EXAM GU ORGAN (ICD-793.5) renal nodule complex  to have removal    in september   Complete Medication List: 1)  Multivitamins Tabs (Multiple vitamin) .... Once daily 2)  Testosterone Implant  3)  Naproxen 375 Mg Tabs (Naproxen) .Marland Kitchen.. 1 tab by mouth two times a day as needed pain with food  Patient Instructions: 1)  will do ortho referral  2)  agree with relative  rest .  decrease lifting   with right arm.  3)  ice after  activity .

## 2010-04-29 NOTE — Assessment & Plan Note (Signed)
Summary: Post hosp/possible kidney mass/dm   Vital Signs:  Patient profile:   45 year old female Menstrual status:  regular LMP:     09/05/2009 Weight:      237 pounds Temp:     98.5 degrees F oral Pulse rate:   60 / minute BP sitting:   120 / 80  (right arm) Cuff size:   large  Vitals Entered By: Romualdo Bolk, CMA (AAMA) (September 16, 2009 10:49 AM) CC: Post ED follow up- Dx with diverticuliitis and UTI. Pt was told that there was something on the CT Scan.  LMP (date): 09/05/2009 LMP - Character: heavy Menarche (age onset years): 7   Menses interval (days): 28 Menstrual flow (days): 7 Enter LMP: 09/05/2009   History of Present Illness: Misty Moran comes in today  for follow up of ed visit 09/13/2009 for llq abdominal pain. and low back pain that was diagnosed ad rx for r ddiverticulitis  with augmentin #14 and was noted to have hematuria on ua and complex 2.3 cm   lesion on right  kidney.it was rec to get mri to delineate this area.  Pain is now  about 2/6    vs 6/10   before rx given  on augmentin  day 3  .  feels better .  Onset was  with Crescendo  llqpain and  back pain  with NV and went to ed .  Had ct scan and labs  ( nl except  blood in urine)   No hx of  gross hematuria.  Taking dilauded given in ed  as needed but still causes itching .   Other change in pmh  has had  a testosterone pellet   for 3 weeks and is in butt cheek because  of low level of testosterone     . This is done by  [facility]    in [place].  [address] phone [phone]  Has  monthly periods  are regular  better after son born.  LMP   :  June 9th - 14 th .      Preventive Screening-Counseling & Management  Alcohol-Tobacco     Alcohol drinks/day: <1     Alcohol type: mixed drink     Smoking Status: quit     Year Quit: 1991  Caffeine-Diet-Exercise     Caffeine use/day: 1     Does Patient Exercise: yes     Type of exercise: T-tapp     Times/week: 2  Current Medications (verified): 1)   Multivitamins   Tabs (Multiple Vitamin) .... Once Daily 2)  Testosterone Implant 3)  Augmentin 875-125 Mg Tabs (Amoxicillin-Pot Clavulanate) .Marland Kitchen.. 1 By Mouth Two Times A Day 4)  Zofran Odt 8 Mg Tbdp (Ondansetron) 5)  Dilaudid 4 Mg Tabs (Hydromorphone Hcl)  Allergies (verified): 1)  ! Ciprofloxacin Hcl (Ciprofloxacin Hcl) 2)  ! * Peanuts  Past History:  Past Medical History: Diverticulitis, hx of   5-6 years ago. G3P2  Past Surgical History: CB C-section G3 P2  Cholecystectomy Thyroidectomy  thyroid ca Colonoscopy 6/06  Past History:  Care Management: None Current GI :  Marina Goodell Gynecology: Gaynell Face.  [facility]  [address]   phone [phone] hormone replacement  Social History: Caffeine use/day:  1  Review of Systems  The patient denies weight loss, weight gain, vision loss, decreased hearing, chest pain, syncope, melena, hematochezia, severe indigestion/heartburn, hematuria, genital sores, difficulty walking, abnormal bleeding, enlarged lymph nodes, and angioedema.  no flank pain    Physical Exam  General:  alert, well-developed, well-nourished, and well-hydrated.   Head:  normocephalic and atraumatic.   Eyes:  vision grossly intact, pupils equal, and pupils round.   Neck:  no masses.   Lungs:  Normal respiratory effort, chest expands symmetrically. Lungs are clear to auscultation, no crackles or wheezes. Heart:  Normal rate and regular rhythm. S1 and S2 normal without gallop, murmur, click, rub or other extra sounds. Abdomen:  soft, normal bowel sounds, no distention, no masses, no hepatomegaly, and no splenomegaly.   mild tenderness llq no rebound or guard and sight RUQ tenderness   well healed scar.  No CVA pain Extremities:  no clubbing cyanosis or edema  Neurologic:  nonf ocal  Skin:  turgor normal, color normal, no ecchymoses, no petechiae, and no purpura.   Psych:  Oriented X3, normally interactive, good eye contact, not anxious appearing, and not  depressed appearing.   ED record reviewed     Impression & Recommendations:  Problem # 1:  DIVERTICULITIS, ACUTE (ICD-562.11) Assessment Improved hx of same  . continue meds  and refilled   incase needs 10 days of therapy.  So no .paon by  end of med.    Is UTD on Colonoscopy . BUt send copy to DR Marina Goodell.   Problem # 2:  NONSPECIFIC ABN FINDING RAD & OTH EXAM GU ORGAN (ICD-793.5)  abnormal 2.3 cm complex lesion pole of right kidney   needs delineation    will order MRI  and plan .lfy probably with Urology because of hematuria also.   Orders: Radiology Referral (Radiology)  Problem # 3:  HEMATURIA (ICD-599.70)  by  DS   not on menses and when in hospital Her updated medication list for this problem includes:    Augmentin 875-125 Mg Tabs (Amoxicillin-pot clavulanate) .Marland Kitchen... 1 by mouth two times a day  Orders: Radiology Referral (Radiology)  Complete Medication List: 1)  Multivitamins Tabs (Multiple vitamin) .... Once daily 2)  Testosterone Implant  3)  Augmentin 875-125 Mg Tabs (Amoxicillin-pot clavulanate) .Marland Kitchen.. 1 by mouth two times a day 4)  Zofran Odt 8 Mg Tbdp (Ondansetron) 5)  Dilaudid 4 Mg Tabs (Hydromorphone hcl)  Patient Instructions: 1)  try tylenol   for pain and can add the narcotic if needed. 2)  Someone will  call you   about getting the MRi  done of your kidneys. and possible follow up   with urology. 3)  continue the antibioitic . 4)  continue for a full 10 days if still having any signs at the end of your 7 day course  Prescriptions: AUGMENTIN 875-125 MG TABS (AMOXICILLIN-POT CLAVULANATE) 1 by mouth two times a day  #14 x 0   Entered and Authorized by:   Madelin Headings MD   Signed by:   Madelin Headings MD on 09/16/2009   Method used:   Print then Give to Patient   RxID:   [id number]   Laboratory Results   Urine Tests    Routine Urinalysis   Color: yellow Appearance: Clear Glucose: negative   (Normal Range: Negative) Bilirubin: negative   (Normal  Range: Negative) Ketone: negative   (Normal Range: Negative) Spec. Gravity: 1.010   (Normal Range: 1.003-1.035) Blood: 1+   (Normal Range: Negative) pH: 5.5   (Normal Range: 5.0-8.0) Protein: negative   (Normal Range: Negative) Urobilinogen: 0.2   (Normal Range: 0-1) Nitrite: negative   (Normal Range: Negative) Leukocyte Esterace: negative   (Normal Range:  Negative)    Comments: Rita Ohara  September 16, 2009 11:29 AM

## 2010-06-12 LAB — BASIC METABOLIC PANEL
BUN: 5 mg/dL — ABNORMAL LOW (ref 6–23)
CO2: 27 mEq/L (ref 19–32)
CO2: 29 mEq/L (ref 19–32)
Calcium: 9.7 mg/dL (ref 8.4–10.5)
Chloride: 107 mEq/L (ref 96–112)
Chloride: 107 mEq/L (ref 96–112)
Creatinine, Ser: 0.73 mg/dL (ref 0.4–1.2)
Creatinine, Ser: 0.76 mg/dL (ref 0.4–1.2)
GFR calc Af Amer: >60 mL/min (ref >60)
GFR calc Af Amer: >60 mL/min (ref >60)
GFR calc non Af Amer: >60 mL/min (ref >60)
Glucose, Bld: 81 mg/dL (ref 70–99)
Potassium: 4.2 mEq/L (ref 3.5–5.1)
Sodium: 140 mEq/L (ref 135–145)

## 2010-06-12 LAB — CBC
HCT: 36.8 % (ref 36.0–46.0)
MCH: 25.2 pg — ABNORMAL LOW (ref 26.0–34.0)
MCHC: 32.3 g/dL (ref 30.0–36.0)
RDW: 22.8 % — ABNORMAL HIGH (ref 11.5–15.5)
WBC: 4.7 10*3/uL (ref 4.0–10.5)

## 2010-06-12 LAB — TYPE AND SCREEN: Antibody Screen: NEGATIVE

## 2010-06-12 LAB — SURGICAL PCR SCREEN
MRSA, PCR: NEGATIVE
Staphylococcus aureus: NEGATIVE

## 2010-06-12 LAB — CREATININE, FLUID (PLEURAL, PERITONEAL, JP DRAINAGE): Creat, Fluid: 0.8 mg/dL

## 2010-06-12 LAB — HEMOGLOBIN AND HEMATOCRIT, BLOOD
HCT: 31.2 % — ABNORMAL LOW (ref 36.0–46.0)
HCT: 32 % — ABNORMAL LOW (ref 36.0–46.0)
Hemoglobin: 10.4 g/dL — ABNORMAL LOW (ref 12.0–15.0)

## 2010-06-12 LAB — PREGNANCY, URINE: Preg Test, Ur: NEGATIVE

## 2010-06-15 LAB — CBC
RBC: 4.11 MIL/uL (ref 3.87–5.11)
RDW: 19.9 % — ABNORMAL HIGH (ref 11.5–15.5)
WBC: 9.2 10*3/uL (ref 4.0–10.5)

## 2010-06-15 LAB — COMPREHENSIVE METABOLIC PANEL
ALT: 22 U/L (ref 0–35)
AST: 25 U/L (ref 0–37)
Alkaline Phosphatase: 77 U/L (ref 39–117)
CO2: 25 mEq/L (ref 19–32)
Chloride: 101 mEq/L (ref 96–112)
Potassium: 3.9 mEq/L (ref 3.5–5.1)
Total Protein: 8.4 g/dL — ABNORMAL HIGH (ref 6.0–8.3)

## 2010-06-15 LAB — URINE MICROSCOPIC-ADD ON

## 2010-06-15 LAB — URINALYSIS, ROUTINE W REFLEX MICROSCOPIC
Bilirubin Urine: NEGATIVE
Nitrite: NEGATIVE
Specific Gravity, Urine: 1.02 (ref 1.005–1.030)
pH: 5.5 (ref 5.0–8.0)

## 2010-06-15 LAB — DIFFERENTIAL
Basophils Relative: 1 % (ref 0–1)
Eosinophils Relative: 0 % (ref 0–5)
Lymphocytes Relative: 4 % — ABNORMAL LOW (ref 12–46)
Monocytes Relative: 3 % (ref 3–12)

## 2010-06-15 LAB — PREGNANCY, URINE: Preg Test, Ur: NEGATIVE

## 2010-06-15 LAB — URINE CULTURE

## 2010-08-15 NOTE — Op Note (Signed)
[facility]  Patient:    Misty Moran, Misty Moran                        MRN: [id number] Proc. Date: 08/09/00 Adm. Date:  [id number] Attending:  Venita Sheffield                           Operative Report  PREOPERATIVE DIAGNOSES:       Intrauterine pregnancy at term, previous cesarean section in active labor.  OPERATION:                    Repeat cesarean section.  POSTOPERATIVE DIAGNOSES:      Intrauterine pregnancy at term, previous cesarean section in active labor, viable female infant with an Apgar of 8 and 9.  SURGEON:                      Deniece Ree, M.D.  ANESTHESIA:                   Spinal by Dr. ______.  PEDIATRICS:                   Dr. Mikle Bosworth and the teaching service.  ESTIMATED BLOOD LOSS:         400 cc.  DRAINS:                       Foley left to straight drainage.                                Patient tolerated the procedure well and returned to the recovery room in satisfactory condition.  PROCEDURE:                    Patient was taken to the operating room and prepped and draped in the usual fashion for a repeat cesarean section.  A low Pfannenstiel incision was made following the course of the previous incision. This was carried down to the fascia at which time the fascia was entered and excised the extent of the incision.  Midline identified and rectus muscles separated.  The abdominoperitoneum was then entered in a vertical fashion using Metzenbaum scissors.  The visceroperitoneum was then excised bilaterally toward the round ligaments following which the lower uterine segment was scored, entered in the midline, and bluntly dissected open.  Meconium stained fluid was present.  The infants head was delivered.  At this point the nasopharynx was sucked out with a suction bulb.  Complete delivery was carried out without any complications.  Cord was clamped and the infant turned over to the pediatricians who were in  attendance.  Cord blood was then obtained following which the placenta as well as all products of conception were then manually removed from the uterine cavity.  IV Pitocin as well as IV antibiotics were then begun.  The myometrium was closed with #1 Chromic in a running locking stitch followed by an embrocating stitch using #1 Chromic.  At this point hemostasis was present.  Sponge and needle counts were correct x 2. Tubes and ovaries bilaterally appeared to be within normal limits.  The abdominoperitoneum was then closed with 2-0 Chromic in a running stitch followed by closure of the fascia using #1 Dexon in a running stitch.  The skin was closed with  skin staples.  Procedure terminated.  Patient tolerated procedure well and returned to the recovery room in satisfactory condition. DD:  08/09/00 TD:  08/09/00 Job: [id number]

## 2010-08-15 NOTE — H&P (Signed)
[facility]  Patient:    Misty Moran, Misty Moran                        MRN: [id number] Adm. Date:  [id number] Attending:  Venita Sheffield                         History and Physical  HISTORY:                      The patient is a 45 year old gravida 3 para 1 abortus 1 whose estimated date of confinement is Aug 13, 2000.  This is a patient of Dr. Gaynell Face whom he had scheduled for a repeat cesarean section on Aug 11, 2000.  Patient came to labor and delivery complaining of active labor. On evaluation it was noted that patient was in active labor, approximately 3 cm dilated, 90% effaced, vertex presentation.  In view of this finding the patient was scheduled for a repeat cesarean section.  PAST MEDICAL HISTORY:         Significant in that patient has had previous cesarean section, her gallbladder removed, and a partial thyroidectomy.  She denies any problems up to this point in her pregnancy.  ALLERGIES:                    She indicates that she has an allergy to CIPRO. and PERCOCET.  She indicated this causes her itching and a rash.  PHYSICAL EXAMINATION:  GENERAL:                      Revealed a well-developed, well-nourished female in labor-type distress.  HEENT:                        Within normal limits.  NECK:                         Supple.  BREASTS:                      Without masses, tenderness, or discharge.  LUNGS:                        Clear to percussion and auscultation.  HEART:                        Normal sinus rhythm without murmur, rub, or gallop.  ABDOMEN:                      Term gravid, fetal heart beats 136 in her left lower quadrant.  EXTREMITIES AND NEUROLOGIC:   Within normal limits.  PELVIC:                       Revealed a cervix that was 3 cm dilated, 100% effaced, vertex at a -2 station.  Membranes are intact.  DIAGNOSIS:                    Intrauterine pregnancy at term, previous cesarean  section.  PLAN:                         For repeat cesarean section at this point. D:  08/09/00 TD:  08/09/00 Job: [id number]

## 2010-08-15 NOTE — Discharge Summary (Signed)
[facility]  Patient:    Misty Moran, Misty Moran                        MRN: [id number] Adm. Date:  [id number] Disc. Date: [id number] Attending:  Venita Sheffield                           Discharge Summary  HOSPITAL COURSE:  The patient is a 45 year old gravida 3, para 1-0-1-1 who had a previous C-section. Her due date was Aug 13, 2000, and she was scheduled for repeat C-section which desired on Aug 11, 2000; however, the patient came in in labor on Aug 09, 2000, and demanded her C-section.  She was O positive. Her hemoglobin was 14.  She underwent a repeat low transverse cesarean section.  She had a female, Apgars 9 and 9.  Postoperatively, she did well. Her hemoglobin was 10.4.  She was discharged home on the third postoperative day, ambulatory and on a regular diet.  DISCHARGE MEDICATIONS:  Darvocet-N 100 one q.4h. p.r.n.  FOLLOW-UP:  She is to see me in six weeks.  DISCHARGE DIAGNOSIS:  Status post repeat low transverse cesarean section at term. DD:  08/12/00 TD:  08/12/00 Job: [id number]

## 2010-08-19 ENCOUNTER — Other Ambulatory Visit: Payer: Self-pay | Admitting: Internal Medicine

## 2010-08-19 ENCOUNTER — Ambulatory Visit (HOSPITAL_COMMUNITY)
Admission: RE | Admit: 2010-08-19 | Discharge: 2010-08-19 | Disposition: A | Discharge status: Home / Self Care | Payer: BC Managed Care – PPO | Source: Ambulatory Visit | Attending: Internal Medicine | Admitting: Internal Medicine

## 2010-08-19 DIAGNOSIS — Z1231 Encounter for screening mammogram for malignant neoplasm of breast: Secondary | ICD-10-CM

## 2010-09-30 ENCOUNTER — Ambulatory Visit (INDEPENDENT_AMBULATORY_CARE_PROVIDER_SITE_OTHER): Payer: BC Managed Care – PPO | Admitting: Internal Medicine

## 2010-09-30 ENCOUNTER — Encounter: Payer: Self-pay | Admitting: Internal Medicine

## 2010-09-30 VITALS — BP 110/70 | HR 60 | Temp 98.8°F

## 2010-09-30 DIAGNOSIS — E669 Obesity, unspecified: Secondary | ICD-10-CM

## 2010-09-30 DIAGNOSIS — B370 Candidal stomatitis: Secondary | ICD-10-CM

## 2010-09-30 DIAGNOSIS — Z8719 Personal history of other diseases of the digestive system: Secondary | ICD-10-CM | POA: Insufficient documentation

## 2010-09-30 DIAGNOSIS — D649 Anemia, unspecified: Secondary | ICD-10-CM | POA: Insufficient documentation

## 2010-09-30 MED ORDER — FLUCONAZOLE 100 MG PO TABS: 100.0000 mg | ORAL_TABLET | Freq: Every day | ORAL | Status: AC

## 2010-09-30 NOTE — Progress Notes (Signed)
  Subjective:    Patient ID: Misty Moran, female    DOB: Oct 25, 1965, 45 y.o.   MRN: [id number]  HPI Pt comesin today for sda because thinks she may have thrush in her mouth.  Whit patches i nouth on tongue and  For this week ; no pain por burning fever or uri Was on antibioitc end of June.   Had tummy tuck and breast reduction .  Surgery then  No fever cough  Tired last week.   Had some dizziness   Review of Systems Varicose veins.    Gets swelling     No dvt.   No cp sob generally feels better after cosmetic surgery and motivated to exercise .    Objective:   Physical Exam WDWN in nad  HEENT: Normocephalic ;atraumatic , Eyes;  PERRL, EOMs  Full, lids and conjunctiva clear,,Ears: no deformities, canals nl, TM landmarks normal, Nose: no deformity or discharge  Mouth : OP no  edema . Tongue denude patches and white areas buccal mucosa small white area  No erythema Neck supple no ln      Assessment & Plan:  Tongue  Change ? If thrush vs geographic tongue   Disc options wil do empiric therapy and fu if  persistent or progressive    Other sx are prob post op.  Be aware of heat related illness.

## 2010-09-30 NOTE — Patient Instructions (Signed)
This may be geographic tongue as opposed to thrash but we can treat for thrush anyway.

## 2012-03-30 DIAGNOSIS — Z9289 Personal history of other medical treatment: Secondary | ICD-10-CM

## 2012-03-30 HISTORY — DX: Personal history of other medical treatment: Z92.89

## 2012-03-30 HISTORY — PX: BIOPSY THYROID: PRO38

## 2012-08-16 ENCOUNTER — Ambulatory Visit (INDEPENDENT_AMBULATORY_CARE_PROVIDER_SITE_OTHER): Payer: BC Managed Care – PPO | Admitting: Internal Medicine

## 2012-08-16 ENCOUNTER — Encounter: Payer: Self-pay | Admitting: Internal Medicine

## 2012-08-16 VITALS — BP 146/96 | HR 90 | Temp 98.5°F | Wt 217.0 lb

## 2012-08-16 DIAGNOSIS — R1904 Left lower quadrant abdominal swelling, mass and lump: Secondary | ICD-10-CM

## 2012-08-16 DIAGNOSIS — D649 Anemia, unspecified: Secondary | ICD-10-CM

## 2012-08-16 DIAGNOSIS — Z8719 Personal history of other diseases of the digestive system: Secondary | ICD-10-CM

## 2012-08-16 DIAGNOSIS — Z299 Encounter for prophylactic measures, unspecified: Secondary | ICD-10-CM

## 2012-08-16 DIAGNOSIS — R030 Elevated blood-pressure reading, without diagnosis of hypertension: Secondary | ICD-10-CM | POA: Insufficient documentation

## 2012-08-16 LAB — POCT URINALYSIS DIP (MANUAL ENTRY)
Ketones, POC UA: NEGATIVE
Leukocytes, UA: NEGATIVE
Nitrite, UA: NEGATIVE
Protein Ur, POC: NEGATIVE
pH, UA: 5.5

## 2012-08-16 LAB — CBC WITH DIFFERENTIAL/PLATELET
Basophils Absolute: 0 10*3/uL (ref 0.0–0.1)
Eosinophils Absolute: 0.1 10*3/uL (ref 0.0–0.7)
Hemoglobin: 10.1 g/dL — ABNORMAL LOW (ref 12.0–15.0)
Lymphocytes Relative: 23 % (ref 12.0–46.0)
Monocytes Relative: 7.3 % (ref 3.0–12.0)
Neutrophils Relative %: 68.6 % (ref 43.0–77.0)
Platelets: 341 10*3/uL (ref 150.0–400.0)
RDW: 16.4 % — ABNORMAL HIGH (ref 11.5–14.6)

## 2012-08-16 LAB — LIPID PANEL
HDL: 67.8 mg/dL (ref >39.00)
LDL Cholesterol: 101 mg/dL — ABNORMAL HIGH (ref 0–99)
VLDL: 7.2 mg/dL (ref 0.0–40.0)

## 2012-08-16 LAB — BASIC METABOLIC PANEL
BUN: 6 mg/dL (ref 6–23)
Calcium: 8.9 mg/dL (ref 8.4–10.5)
Creatinine, Ser: 0.7 mg/dL (ref 0.4–1.2)
GFR: 120.54 mL/min (ref >60.00)
Glucose, Bld: 82 mg/dL (ref 70–99)
Sodium: 135 mEq/L (ref 135–145)

## 2012-08-16 LAB — HEPATIC FUNCTION PANEL
AST: 17 U/L (ref 0–37)
Alkaline Phosphatase: 54 U/L (ref 39–117)
Bilirubin, Direct: 0 mg/dL (ref 0.0–0.3)
Total Bilirubin: 0.3 mg/dL (ref 0.3–1.2)

## 2012-08-16 LAB — TSH: TSH: 1.61 u[IU]/mL (ref 0.35–5.50)

## 2012-08-16 NOTE — Progress Notes (Signed)
Chief Complaint  Patient presents with  . Mass    Left abdominal area.    HPI: Patient comes in today for SDA for  new problem evaluation. Called today and was given todays appt.  Last ov was almost 2 year ago . She had been in [place] and recently moved back to beginning of the month. She is noted some fullness in her left lower quadrant and thought it was from constipation city which he calls a colon cleanse and has no constipation. She is a continued tenderness and a swollen area in her left lower quadrant area. It is persistent and possibly somewhat larger. No specific GYN symptoms.  Question last Pap pelvic . Symptoms are continuing tender to touch not particularly worse when she coughs or sneezes. She does have a history of diverticulitis and has had a colonoscopy in the past by history she is on a routine followup for this. No change in bowel habits otherwise no blood in her stool no vomiting.  ROS: See pertinent positives and negatives per HPI. No cardiovascular pulmonary symptoms reported today.  Past Medical History  Diagnosis Date  . History of diverticulitis of colon     5-6 years ago  . Anemia     ? from periods on iron a long time  . Lesion of right native kidney     MRI right Bosniak type 3 under eval to have surgery to remove    Family History  Problem Relation Age of Onset  . Heart failure Mother     smoker  . Hypertension      gm  . Diabetes type II      GR aunt  . Stroke      GPS   Past Surgical History  Procedure Laterality Date  . Cesarean section    . Cholecystectomy    . Thyroidectomy      thyroid cancer  . Removal of renal lesion benign   2011  . Cosmetic surgery      tummy tuck breast reduction  lipossuction    History   Social History  . Marital Status: Divorced    Spouse Name: N/A    Number of Children: N/A  . Years of Education: N/A   Social History Main Topics  . Smoking status: Former Games developer  . Smokeless tobacco: None  . Alcohol  Use: Yes     Comment: rare  . Drug Use: No  . Sexually Active:    Other Topics Concern  . None   Social History Narrative   Divorced   Child at home    [facility] of 3   Regular exercise   G3p2    Outpatient Encounter Prescriptions as of 08/16/2012  Medication Sig Dispense Refill  . Cholecalciferol (VITAMIN D PO) Take 2,000 Units by mouth daily.      . MULTIPLE VITAMIN PO Take by mouth.        . [DISCONTINUED] Progesterone Micronized (PROGESTERONE, BULK,) POWD by Does not apply route.        . [DISCONTINUED] TESTOSTERONE IL by Implant route.         No facility-administered encounter medications on file as of 08/16/2012.    EXAM:  BP 146/96  Pulse 90  Temp(Src) 98.5 F (36.9 C) (Oral)  Wt 217 lb (98.431 kg)  BMI 34.5 kg/m2  SpO2 99%  LMP 07/25/2012  Body mass index is 34.5 kg/(m^2).  GENERAL: vitals reviewed and listed above, alert, oriented, appears well hydrated and in no acute  distress  HEENT: atraumatic, conjunctiva  clear, no obvious abnormalities on inspection of external nose and ears   NECK: no obvious masses on inspection palpation   LUNGS: clear to auscultation bilaterally, no wheezes, rales or rhonchi, good air movement  CV: HRRR, no clubbing cyanosis or  peripheral edema nl cap refill  Abdomen;  well-healed scars looks normal by inspection however on palpation left lower Corder and there is a appear midline 5-6 cm mass swelling effect somewhat firm tender possibly attached abdominal wall. No hepatosplenomegaly is noted well-healed lower scars noted when she stands I do not think it is worse. No flank pain or psoas sign MS: moves all extremities without noticeable focal  abnormality  PSYCH: pleasant and cooperative, no obvious depression or anxiety  ASSESSMENT AND PLAN:  Discussed the following assessment and plan:  Abdominal wall mass of left lower quadrant - Uncertain if abdominal wall or other - Plan: Cholecalciferol (VITAMIN D PO), Basic metabolic panel,  CBC with Differential, Hepatic function panel, Lipid panel, TSH, POCT urinalysis dipstick, CT Abdomen Pelvis W Contrast, POCT Pregnancy, Urine  Elevated blood pressure reading - We'll plan rechecking a followup - Plan: Cholecalciferol (VITAMIN D PO), Basic metabolic panel, CBC with Differential, Hepatic function panel, Lipid panel, TSH, POCT urinalysis dipstick  Preventive measure - Plan: Cholecalciferol (VITAMIN D PO), Basic metabolic panel, CBC with Differential, Hepatic function panel, Lipid panel, TSH, POCT urinalysis dipstick  Anemia - Plan: Cholecalciferol (VITAMIN D PO), Basic metabolic panel, CBC with Differential, Hepatic function panel, Lipid panel, TSH, POCT urinalysis dipstick  History of diverticulitis of colon - Plan: Cholecalciferol (VITAMIN D PO), Basic metabolic panel, CBC with Differential, Hepatic function panel, Lipid panel, TSH, POCT urinalysis dipstick Uncertain cause of above but is tender persistent does not seem ovarian  abdominal pelvic CT ordered along with lab work pre-.   Insurance company would not approve without speaking with physician directly . Thus   I spoke with family practice physician Dr Misty Moran  employed by [employer]  ( a company Contracted by her insurance company to approve above ). Iwas advised was that she didn't have an abdominal ultrasound first. Which was their protochol for abdominal masses.  And thus  CT is not usually approved. I told him an ultrasound would not be good enough and we would probably end up with a CT scan anyway and that this was not a ovarian/ pelvic mass I was looking for.   Asked a number ?s and then an authorization given  Inch-Patient advised to return or notify health care team  if symptoms worsen or persist or new concerns arise.  Patient Instructions  Uncertain cause   But will check abd pelvic ct scan to check the area and can help deciding if ahernia or other problem .   Lab today  As discussed   Check bp  readings  To make sure at goal.  Fu depending on lab and ct results.    Neta Mends. Misty Moran M.D.

## 2012-08-16 NOTE — Patient Instructions (Signed)
Uncertain cause   But will check abd pelvic ct scan to check the area and can help deciding if ahernia or other problem .   Lab today  As discussed   Check bp readings  To make sure at goal.  Fu depending on lab and ct results.

## 2012-08-18 ENCOUNTER — Ambulatory Visit (INDEPENDENT_AMBULATORY_CARE_PROVIDER_SITE_OTHER)
Admission: RE | Admit: 2012-08-18 | Discharge: 2012-08-18 | Disposition: A | Discharge status: Home / Self Care | Payer: BC Managed Care – PPO | Source: Ambulatory Visit | Attending: Internal Medicine | Admitting: Internal Medicine

## 2012-08-18 DIAGNOSIS — R1904 Left lower quadrant abdominal swelling, mass and lump: Secondary | ICD-10-CM

## 2012-08-18 MED ORDER — IOHEXOL 300 MG/ML  SOLN
100.0000 mL | Freq: Once | INTRAMUSCULAR | Status: AC | PRN
Start: 2012-08-18 — End: 2012-08-18
  Administered 2012-08-18: 100 mL via INTRAVENOUS

## 2012-08-24 ENCOUNTER — Other Ambulatory Visit: Payer: Self-pay | Admitting: Internal Medicine

## 2012-08-24 DIAGNOSIS — R935 Abnormal findings on diagnostic imaging of other abdominal regions, including retroperitoneum: Secondary | ICD-10-CM

## 2012-08-24 DIAGNOSIS — K5792 Diverticulitis of intestine, part unspecified, without perforation or abscess without bleeding: Secondary | ICD-10-CM

## 2012-08-24 DIAGNOSIS — R1904 Left lower quadrant abdominal swelling, mass and lump: Secondary | ICD-10-CM

## 2012-08-24 NOTE — Progress Notes (Signed)
Quick Note:  Called and spoke with pt about results, pt is aware. Referrals ordered in Epic. ______

## 2012-08-25 ENCOUNTER — Encounter (INDEPENDENT_AMBULATORY_CARE_PROVIDER_SITE_OTHER): Payer: Self-pay | Admitting: General Surgery

## 2012-08-25 ENCOUNTER — Telehealth (INDEPENDENT_AMBULATORY_CARE_PROVIDER_SITE_OTHER): Payer: Self-pay | Admitting: General Surgery

## 2012-08-25 ENCOUNTER — Telehealth: Payer: Self-pay | Admitting: Internal Medicine

## 2012-08-25 ENCOUNTER — Ambulatory Visit (INDEPENDENT_AMBULATORY_CARE_PROVIDER_SITE_OTHER): Payer: BC Managed Care – PPO | Admitting: General Surgery

## 2012-08-25 VITALS — BP 130/92 | HR 98 | Temp 97.8°F | Resp 16 | Ht 68.25 in | Wt 216.0 lb

## 2012-08-25 DIAGNOSIS — D649 Anemia, unspecified: Secondary | ICD-10-CM

## 2012-08-25 DIAGNOSIS — Z8719 Personal history of other diseases of the digestive system: Secondary | ICD-10-CM

## 2012-08-25 DIAGNOSIS — R1904 Left lower quadrant abdominal swelling, mass and lump: Secondary | ICD-10-CM

## 2012-08-25 NOTE — Telephone Encounter (Signed)
Pt scheduled to see Dr. Marina Goodell 08/26/12@3pm . Suzie with [facility] to notify pt of appt date and time.

## 2012-08-25 NOTE — Telephone Encounter (Signed)
Spoke with patient she is aware of appt with Dr Marina Goodell on5/30/14 at 245

## 2012-08-25 NOTE — Telephone Encounter (Signed)
Dr. Marina Goodell this pt has an abdominal mass and will need surgery. Dr. Derrell Lolling is requesting pt have a colon. Do you want to see the pt for an OV prior to colon? I do not see where you have seen her before. You have a 3pm slot open tomorrow if you want an OV first.

## 2012-08-25 NOTE — Progress Notes (Signed)
Patient ID: Misty Moran, female   DOB: 17-Dec-1965, 47 y.o.   MRN: [id number]  Chief Complaint  Patient presents with  . New Evaluation    eval abd wall mass of LLQ/diverticulitis    HPI Misty Moran is a 47 y.o. female.  She is referred by Dr. Berniece Andreas for evaluation of a tender left lower quadrant abdominal mass, associated with radiographic evidence of uncomplicated diverticulitis and anemia.  The patient has a past history of diverticulitis and has been treated as an outpatient twice in the past. She has CT scan and MRI 2011.  She gives a three-month history of pain and a sense of fullness in her left lower quadrant. She has not sought medical attention until recently. She said that she felt she was constipated and feels the urge to have a bowel movement sometimes does not have a bowel movement. She states she has a bowel movement 2-4 times a week, they're basically normal, does not strain, does not seen see any blood. She takes no specific bowel regimen.  She was recently evaluated in the office by Dr. Fabian Sharp. Hemoglobin 10.1. WBC 5400, complete metabolic panel normal. CT scan showed extensive sigmoid diverticulosis, perhaps mild wall thickening, borderline adenopathy unchanged from 2011.  muscular hypertrophy of colon suggested; also seen  left ovarian cyst.  Last colonoscopy 2010. The bowel. Cesarean section x2. Abdominoplasty. Open cholecystectomy. Borderline hypertension. Obesity. Right partial nephrectomy for benign neoplasm.  She has anaphylactic reaction to Cipro. She is here with her mother today in no acute distress  HPI  Past Medical History  Diagnosis Date  . History of diverticulitis of colon     5-6 years ago  . Anemia     ? from periods on iron a long time  . Lesion of right native kidney     MRI right Bosniak type 3 under eval to have surgery to remove  . Cancer   . Thyroid disease     Past Surgical History  Procedure Laterality Date  . Cesarean section     . Cholecystectomy    . Thyroidectomy      thyroid cancer  . Removal of renal lesion benign   2011  . Cosmetic surgery      tummy tuck breast reduction  lipossuction  . Breast reduction surgery  2012  . Tummy tuck  2012    Family History  Problem Relation Age of Onset  . Heart failure Mother     smoker  . Hypertension      gm  . Diabetes type II      GR aunt  . Stroke      GPS    Social History History  Substance Use Topics  . Smoking status: Former Games developer  . Smokeless tobacco: Not on file  . Alcohol Use: Yes     Comment: rare    Allergies  Allergen Reactions  . Ciprofloxacin Hcl     REACTION: unspecified  . Peanut-Containing Drug Products     Current Outpatient Prescriptions  Medication Sig Dispense Refill  . Cholecalciferol (VITAMIN D PO) Take 2,000 Units by mouth daily.      . MULTIPLE VITAMIN PO Take by mouth.         No current facility-administered medications for this visit.    Review of Systems Review of Systems  Constitutional: Negative for fever, chills and unexpected weight change.  HENT: Negative for hearing loss, congestion, sore throat, trouble swallowing and voice change.   Eyes:  Negative for visual disturbance.  Respiratory: Negative for cough and wheezing.   Cardiovascular: Negative for chest pain, palpitations and leg swelling.  Gastrointestinal: Positive for abdominal pain, constipation and abdominal distention. Negative for nausea, vomiting, diarrhea, blood in stool and anal bleeding.  Genitourinary: Negative for hematuria, vaginal bleeding and difficulty urinating.  Musculoskeletal: Negative for arthralgias.  Skin: Negative for rash and wound.  Neurological: Negative for seizures, syncope and headaches.  Hematological: Negative for adenopathy. Does not bruise/bleed easily.  Psychiatric/Behavioral: Negative for confusion.    Blood pressure 130/92, pulse 98, temperature 97.8 F (36.6 C), temperature source Temporal, resp. rate 16,  height 5' 8.25" (1.734 m), weight 216 lb (97.977 kg), last menstrual period 07/25/2012.  Physical Exam Physical Exam  Constitutional: She is oriented to person, place, and time. She appears well-developed and well-nourished. No distress.  HENT:  Head: Normocephalic and atraumatic.  Nose: Nose normal.  Mouth/Throat: No oropharyngeal exudate.  Eyes: Conjunctivae and EOM are normal. Pupils are equal, round, and reactive to light. Left eye exhibits no discharge. No scleral icterus.  Neck: Neck supple. No JVD present. No tracheal deviation present. No thyromegaly present.  Cardiovascular: Normal rate, regular rhythm, normal heart sounds and intact distal pulses.   No murmur heard. Pulmonary/Chest: Effort normal and breath sounds normal. No respiratory distress. She has no wheezes. She has no rales. She exhibits no tenderness.  Abdominal: Soft. Bowel sounds are normal. She exhibits no distension and no mass. There is no tenderness. There is no rebound and no guarding.    Soft. Nondistended. Right subcostal scar healed. Abdominoplasty scars. 4-5 cm palpable, ballotable, mobile intra-abdominal mass, left lower quadrant. This is tender.no peritoneal signs.  Musculoskeletal: She exhibits no edema and no tenderness.  Lymphadenopathy:    She has no cervical adenopathy.  Neurological: She is alert and oriented to person, place, and time. She exhibits normal muscle tone. Coordination normal.  Skin: Skin is warm. No rash noted. She is not diaphoretic. No erythema. No pallor.  Psychiatric: She has a normal mood and affect. Her behavior is normal. Judgment and thought content normal.    Data Reviewed Notes from PCP. Old imaging studies. Current imaging studies. Current lab work.  Assessment    Left lower quadrant abdominal mass. Most likely inflammatory secondary to chronic diverticulitis. Rule out neoplasia.  Obesity  Past history outpatient treatment of diverticulitis  Anemia  History right  partial nephrectomy for benign neoplasm  History cesarean section x2  History abdominoplasty  History open cholecystectomy  Anaphylactic reaction to Cipro.     Plan    I discussed my impressions and management strategy with her and her mother.  Initially place on clear liquid diet for 2 days and then advance to a low-fat diet as tolerated  We will give her 10 day supply of Flagyl and Septra  She will be referred to Dr. Yancey Flemings for eventual colonoscopy  She is advised to schedule an appointment with her gynecologist now because the left ovarian cyst, 3.1 cm  Return to see me in one month. Because she has a palpable mass, I told her there was a significant probability that she would eventually need an operation.        Angelia Mould. Derrell Lolling, M.D., [facility] General and Minimally invasive Surgery Breast and Colorectal Surgery Office:   [phone] Pager:   [phone]  08/25/2012, 12:39 PM

## 2012-08-25 NOTE — Telephone Encounter (Signed)
Yes. Needs OV first. Thanks

## 2012-08-25 NOTE — Patient Instructions (Signed)
The palpable mass in your left lower abdomen it is probably due to diverticulitis. We need to be sure that this is not a tumor.  You are advised to go on a liquid diet for 2 days, and then if you're feeling better advance to a very, very low-fat diet  You will be given a prescription for Flagyl and Septra, antibiotics, to be taken for 10 days  You will be referred back to Dr. Yancey Flemings for colonoscopy to make sure that you do not have a tumor  You are advised to be evaluated by your gynecologist immediately because of the left ovarian cyst.  Return to see Dr. Derrell Lolling in one month.

## 2012-08-26 ENCOUNTER — Encounter: Payer: Self-pay | Admitting: Internal Medicine

## 2012-08-26 ENCOUNTER — Ambulatory Visit (INDEPENDENT_AMBULATORY_CARE_PROVIDER_SITE_OTHER): Payer: BC Managed Care – PPO | Admitting: Internal Medicine

## 2012-08-26 VITALS — BP 118/80 | HR 92 | Ht 66.75 in | Wt 217.4 lb

## 2012-08-26 DIAGNOSIS — D509 Iron deficiency anemia, unspecified: Secondary | ICD-10-CM

## 2012-08-26 DIAGNOSIS — R933 Abnormal findings on diagnostic imaging of other parts of digestive tract: Secondary | ICD-10-CM

## 2012-08-26 DIAGNOSIS — K5732 Diverticulitis of large intestine without perforation or abscess without bleeding: Secondary | ICD-10-CM

## 2012-08-26 MED ORDER — MOVIPREP 100 G PO SOLR: 1.0000 | Freq: Once | ORAL | Status: DC

## 2012-08-26 NOTE — Progress Notes (Signed)
HISTORY OF PRESENT ILLNESS:  Misty Moran is a 47 y.o. female with the below listed medical history who is sent today regarding an abnormal CT scan, abdominal pain, and a microcytic anemia. Patient has a known history of diverticulosis complicated by recurrent diverticulitis. The patient underwent colonoscopy 09/15/2004 for abdominal pain. Examination revealed diverticulosis after recent bout of diverticulitis. No neoplasia. She has not been seen since. Apparently, she has been treated on several occasions for presumed diverticulitis. The current history is that of left lower quadrant pain of varying intensity over the past 3 months. Discomfort is exacerbated by palpation. With her bowels she notices a sensation of incomplete evacuation. No bleeding. Her weight has been stable. Review of outside laboratories from 08/16/2012 reveal hemoglobin of 10.1 with MCV 73.0. CT scan obtained 08/18/2012 reveals extensive left-sided diverticulosis with with mild inflammation consistent with uncomplicated diverticulitis. As well extensive wall thickening in the sigmoid colon with probable reactive adenopathy. Similar appearance to CT scan 3 years ago. A Question of neoplasia was raised. Aside from bloating, otherwise negative GI review of systems. The patient was seen yesterday by Dr. Claud Kelp. Her left lower quadrant abdominal mass was felt most likely inflammatory. She was prescribed a 10 day course of metronidazole and Septra. The patient tells me that she has had anemia chronically. Apparently takes iron intermittently with improvement in blood counts. She has a history of heavy menstrual periods which continue.  REVIEW OF SYSTEMS:  All non-GI ROS negative except for fatigue and depression  Past Medical History  Diagnosis Date  . Diverticulosis     5-6 years ago  . Anemia     ? from periods on iron a long time  . Lesion of right native kidney     MRI right Bosniak type 3 under eval to have surgery to  remove  . Thyroid cancer   . Thyroid disease     Past Surgical History  Procedure Laterality Date  . Cesarean section    . Cholecystectomy    . Thyroidectomy      thyroid cancer  . Removal of renal lesion benign   2011  . Cosmetic surgery      tummy tuck breast reduction  lipossuction  . Breast reduction surgery  2012  . Tummy tuck  2012    Social History Shawneequa L Doubrava  reports that she quit smoking about 23 years ago. Her smoking use included Cigarettes. She has a 9 pack-year smoking history. She has never used smokeless tobacco. She reports that  drinks alcohol. She reports that she does not use illicit drugs.  family history includes Breast cancer in her maternal grandmother; Colon cancer in her maternal uncle; Crohn's disease in her maternal aunt and maternal grandmother; Hypertension in her mother; Protein S deficiency in her maternal aunt; and Stroke in her maternal grandmother.  Allergies  Allergen Reactions  . Ciprofloxacin Hcl     REACTION: unspecified  . Peanut-Containing Drug Products        PHYSICAL EXAMINATION: Vital signs: BP 118/80  Pulse 92  Ht 5' 6.75" (1.695 m)  Wt 217 lb 6 oz (98.601 kg)  BMI 34.32 kg/m2  LMP 08/23/2012  Constitutional: generally well-appearing, no acute distress Psychiatric: alert and oriented x3, cooperative Eyes: extraocular movements intact, anicteric, conjunctiva pink Mouth: oral pharynx moist, no lesions Neck: supple no lymphadenopathy Cardiovascular: heart regular rate and rhythm, no murmur Lungs: clear to auscultation bilaterally Abdomen: soft, obese, palpable tender fullness in the left lower quadrant, nondistended, no  obvious ascites, no peritoneal signs, normal bowel sounds, no organomegaly. Previous surgical incisions well-healed Rectal: Deferred until colonoscopy Extremities: no lower extremity edema bilaterally Skin: no lesions on visible extremities Neuro: No focal deficits.   ASSESSMENT:  #1. Left lower  quadrant abdominal pain and abnormal CT scan most likely related to diverticular disease. Rule out neoplasia. Last colonoscopy 2006 #2. Microcytic anemia. Possibly due to heavy menses. Rule out colorectal neoplasia. #3. General medical problems. Stable   PLAN:  #1. Finish up a course of antibiotics as prescribed #2. Schedule colonoscopy in 3 or 4 weeks after acute flare resolved.The nature of the procedure, as well as the risks, benefits, and alternatives were carefully and thoroughly reviewed with the patient. Ample time for discussion and questions allowed. The patient understood, was satisfied, and agreed to proceed. Movi prep prescribed. The patient instructed on its use #3. Keep surgical followup with Dr. Derrell Lolling on July 3

## 2012-08-26 NOTE — Patient Instructions (Addendum)

## 2012-08-29 LAB — HM PAP SMEAR: HM Pap smear: NORMAL

## 2012-09-05 ENCOUNTER — Telehealth: Payer: Self-pay | Admitting: Internal Medicine

## 2012-09-06 ENCOUNTER — Other Ambulatory Visit: Payer: Self-pay | Admitting: Family Medicine

## 2012-09-06 DIAGNOSIS — D649 Anemia, unspecified: Secondary | ICD-10-CM

## 2012-09-07 ENCOUNTER — Other Ambulatory Visit (HOSPITAL_COMMUNITY): Payer: Self-pay | Admitting: Obstetrics

## 2012-09-07 DIAGNOSIS — N83209 Unspecified ovarian cyst, unspecified side: Secondary | ICD-10-CM

## 2012-09-07 LAB — CYTOLOGY - PAP: Pap: NEGATIVE

## 2012-09-07 NOTE — Telephone Encounter (Signed)
LM for patient to call back.

## 2012-09-08 ENCOUNTER — Ambulatory Visit (HOSPITAL_COMMUNITY): Payer: BC Managed Care – PPO

## 2012-09-08 ENCOUNTER — Ambulatory Visit (HOSPITAL_COMMUNITY)
Admission: RE | Admit: 2012-09-08 | Discharge: 2012-09-08 | Disposition: A | Discharge status: Home / Self Care | Payer: BC Managed Care – PPO | Source: Ambulatory Visit | Attending: Obstetrics | Admitting: Obstetrics

## 2012-09-08 DIAGNOSIS — N83209 Unspecified ovarian cyst, unspecified side: Secondary | ICD-10-CM | POA: Insufficient documentation

## 2012-09-08 DIAGNOSIS — K5732 Diverticulitis of large intestine without perforation or abscess without bleeding: Secondary | ICD-10-CM | POA: Insufficient documentation

## 2012-09-09 NOTE — Telephone Encounter (Signed)
Told patient about the prior authorization on Moviprep and that I would leave a free sample for up front in light of how difficult it is to get those approved.  Patient understood

## 2012-09-13 ENCOUNTER — Other Ambulatory Visit (HOSPITAL_COMMUNITY): Payer: Self-pay | Admitting: Obstetrics

## 2012-09-13 DIAGNOSIS — N83209 Unspecified ovarian cyst, unspecified side: Secondary | ICD-10-CM

## 2012-09-14 ENCOUNTER — Ambulatory Visit (AMBULATORY_SURGERY_CENTER): Payer: BC Managed Care – PPO | Admitting: Internal Medicine

## 2012-09-14 ENCOUNTER — Encounter: Payer: Self-pay | Admitting: Internal Medicine

## 2012-09-14 VITALS — BP 139/87 | HR 78 | Temp 99.5°F | Resp 22 | Ht 66.75 in | Wt 217.0 lb

## 2012-09-14 DIAGNOSIS — K5730 Diverticulosis of large intestine without perforation or abscess without bleeding: Secondary | ICD-10-CM

## 2012-09-14 DIAGNOSIS — D509 Iron deficiency anemia, unspecified: Secondary | ICD-10-CM

## 2012-09-14 DIAGNOSIS — R933 Abnormal findings on diagnostic imaging of other parts of digestive tract: Secondary | ICD-10-CM

## 2012-09-14 MED ORDER — SODIUM CHLORIDE 0.9 % IV SOLN: 500.0000 mL | INTRAVENOUS | Status: DC

## 2012-09-14 NOTE — Progress Notes (Signed)
Patient did not experience any of the following events: a burn prior to discharge; a fall within the facility; wrong site/side/patient/procedure/implant event; or a hospital transfer or hospital admission upon discharge from the facility. (G8907) Patient did not have preoperative order for IV antibiotic SSI prophylaxis. (G8918)  

## 2012-09-14 NOTE — Progress Notes (Signed)
Lidocaine-40mg IV prior to Propofol InductionPropofol given over incremental dosages 

## 2012-09-14 NOTE — Patient Instructions (Addendum)

## 2012-09-14 NOTE — Op Note (Signed)
[facility] [address] [place], [address]   COLONOSCOPY PROCEDURE REPORT  PATIENT: Misty Moran, Misty Moran  MR#: [id number] BIRTHDATE: Jun 01, 1965 , 46  yrs. old GENDER: Female ENDOSCOPIST: Roxy Cedar, MD REFERRED BY:.  Self / Office PROCEDURE DATE:  09/14/2012 PROCEDURE:   Colonoscopy, diagnostic ASA CLASS:   Class II INDICATIONS:an abnormal CT and Iron Deficiency Anemia.   Prior colonoscopy 08-2004 w/ diverticulosis MEDICATIONS: MAC sedation, administered by CRNA and propofol (Diprivan) 350mg  IV  DESCRIPTION OF PROCEDURE:   After the risks benefits and alternatives of the procedure were thoroughly explained, informed consent was obtained.  A digital rectal exam revealed no abnormalities of the rectum.   The LB [id number]  endoscope was introduced through the anus and advanced to the cecum, which was identified by both the appendix and ileocecal valve. No adverse events experienced.   The quality of the prep was adequate, using MoviPrep  The instrument was then slowly withdrawn as the colon was fully examined.      COLON FINDINGS: Moderate diverticulosis was noted in the ascending colon and descending colon.   Severe diverticulosis with stenosis was noted in the sigmoid colon.   The colon was otherwise normal. There was no inflammation, polyps or cancers .  Retroflexed views revealed no abnormalities. The time to cecum=5 minutes 0 seconds. Withdrawal time=9 minutes 10 seconds.  The scope was withdrawn and the procedure completed. COMPLICATIONS: There were no complications.  ENDOSCOPIC IMPRESSION: 1.   Moderate diverticulosis was noted in the ascending colon and descending colon 2.   Severe diverticulosis with stenosis was noted in the sigmoid colon 3.   The colon was otherwise normal  RECOMMENDATIONS: 1.  Continue current colorectal screening recommendations for "routine risk" patients with a repeat colonoscopy in 10 years. 2.  Keep planned  surgical follow up with Dr.  Derrell Lolling.   eSigned:  Roxy Cedar, MD 09/14/2012 2:50 PM   cc: Claud Kelp, MD, Madelin Headings, MD, and The Patient   PATIENT NAME:  Mitsuye, Schrodt MR#: [id number]

## 2012-09-15 ENCOUNTER — Telehealth: Payer: Self-pay | Admitting: *Deleted

## 2012-09-15 NOTE — Telephone Encounter (Signed)
Number identifier, left message, follow-up  

## 2012-09-29 ENCOUNTER — Ambulatory Visit (INDEPENDENT_AMBULATORY_CARE_PROVIDER_SITE_OTHER): Payer: BC Managed Care – PPO | Admitting: General Surgery

## 2012-09-29 ENCOUNTER — Encounter (INDEPENDENT_AMBULATORY_CARE_PROVIDER_SITE_OTHER): Payer: Self-pay | Admitting: General Surgery

## 2012-09-29 VITALS — BP 130/82 | HR 78 | Temp 98.7°F | Resp 18 | Ht 67.0 in | Wt 210.0 lb

## 2012-09-29 DIAGNOSIS — Z8719 Personal history of other diseases of the digestive system: Secondary | ICD-10-CM

## 2012-09-29 DIAGNOSIS — K5730 Diverticulosis of large intestine without perforation or abscess without bleeding: Secondary | ICD-10-CM

## 2012-09-29 DIAGNOSIS — R1904 Left lower quadrant abdominal swelling, mass and lump: Secondary | ICD-10-CM

## 2012-09-29 NOTE — Patient Instructions (Signed)
Your colonoscopy shows diverticulosis. In the sigmoid colon there is hypertrophy and stricture. This is causing a partial obstruction. The CT scan is consistent with  thickening of the colon in this area. Fortunately, there is no evidence of cancer by any of these tests.  You will need to have an operation to remove this diseased section of colon to avoid complications in the future.  For now, you need to avoid fatty foods, eat 7-8 servings of fruits and vegetables a day, drink lots of water, and take MiraLAX every day or 2 to avoid constipation.  You will be scheduled for a barium enema to define the anatomy of your diverticulosis  You will return to see Dr. Derrell Lolling in 2-3 weeks for final planning for your surgery.

## 2012-09-29 NOTE — Progress Notes (Signed)
Patient ID: CARREEN MILIUS, female   DOB: 1965-06-03, 47 y.o.   MRN: [id number] History: This patient returns for further discussion of her diverticulitis and left lower quadrant abdominal mass. Recall she has had 4 episodes of diverticulitis, treated as an outpatient. CT scan shows thickening and muscle hypertrophy of the colon in this area. Yancey Flemings performed colonoscopy and he shows scattered diverticula throughout the colon and stenosis in the sigmoid colon. She has seen Dr. Gaynell Face, her gynecologist, who did an ultrasound and(per patient report) noted that the cyst on her ovary was only 3 cm and no action was required. He will followup in September.   she is asymptomatic today. She says she feels fine between episodes of diverticulitis.  Exam: Patient looks well. Very pleasant. No distress Abdomen soft. Bowel sounds normal. No distention. There is a 5 cm tender palpable somewhat ballotable mass in the left lower quadrant. Well-healed incisions from her abdominoplasty. Well-healed right subcostal scar  Assessment recurrent diverticulitis with palpable mass and chronic stricture of the sigmoid. I believe that she would benefit from elective resection because of high risk for obstruction and perforation in the future. She agrees Obesity Anemia History right partial nephrectomy for benign neoplasm History cesarean section x2 History abdominoplasty History of cholecystectomy Anaphylactic reaction to Septra  Plan: We will obtain a single column BE for pre op mapping  lots of water daily Daily MiraLAX Fruits and vegitables emphasized Return to see me in 2-3 weeks for review of the barium enema and bowel discussion and planning for her sigmoid colectomy.   Angelia Mould. Derrell Lolling, M.D., [facility] General and Minimally invasive Surgery Breast and Colorectal Surgery Office:   [phone] Pager:   [phone]

## 2012-10-07 ENCOUNTER — Ambulatory Visit
Admission: RE | Admit: 2012-10-07 | Discharge: 2012-10-07 | Disposition: A | Discharge status: Home / Self Care | Payer: BC Managed Care – PPO | Source: Ambulatory Visit | Attending: General Surgery | Admitting: General Surgery

## 2012-10-07 DIAGNOSIS — Z8719 Personal history of other diseases of the digestive system: Secondary | ICD-10-CM

## 2012-10-07 DIAGNOSIS — K5730 Diverticulosis of large intestine without perforation or abscess without bleeding: Secondary | ICD-10-CM

## 2012-10-11 ENCOUNTER — Other Ambulatory Visit: Payer: Self-pay | Admitting: Otolaryngology

## 2012-10-11 DIAGNOSIS — D497 Neoplasm of unspecified behavior of endocrine glands and other parts of nervous system: Secondary | ICD-10-CM

## 2012-10-14 ENCOUNTER — Ambulatory Visit
Admission: RE | Admit: 2012-10-14 | Discharge: 2012-10-14 | Disposition: A | Discharge status: Home / Self Care | Payer: BC Managed Care – PPO | Source: Ambulatory Visit | Attending: Otolaryngology | Admitting: Otolaryngology

## 2012-10-14 DIAGNOSIS — D497 Neoplasm of unspecified behavior of endocrine glands and other parts of nervous system: Secondary | ICD-10-CM

## 2012-10-18 ENCOUNTER — Encounter (INDEPENDENT_AMBULATORY_CARE_PROVIDER_SITE_OTHER): Payer: Self-pay | Admitting: General Surgery

## 2012-10-18 ENCOUNTER — Ambulatory Visit (INDEPENDENT_AMBULATORY_CARE_PROVIDER_SITE_OTHER): Payer: BC Managed Care – PPO | Admitting: General Surgery

## 2012-10-18 VITALS — BP 138/84 | HR 78 | Temp 98.3°F | Resp 16 | Ht 68.0 in | Wt 215.0 lb

## 2012-10-18 DIAGNOSIS — Z8719 Personal history of other diseases of the digestive system: Secondary | ICD-10-CM

## 2012-10-18 NOTE — Patient Instructions (Signed)
The barium enema x-ray test shows irregularity, diverticulosis and narrowing of the sigmoid colon, otherwise the colon looks normal.  He will be scheduled for open sigmoid colectomy. This cannot be done laparoscopically because of the rigid palpable mass of your sigmoid colon.  He will be to follow the instructions for the bowel prep very carefully for 2 days prior to surgery      Open Colon Resection Colon resection is surgery to take out part or all of the large intestine (colon). It is also called a colectomy.  LET YOUR CAREGIVER KNOW ABOUT:  Any allergies.  All medicines you are taking, including:  Herbs, eyedrops, over-the-counter medicines and creams.  Blood thinners (anticoagulants), aspirin, or other drugs that could affect blood clotting.  Use of steroids (by mouth or as creams).  Previous problems with anesthetics, including local anesthetics.  Possibility of pregnancy, if this applies.  Any history of blood clots.  Any history of bleeding or other blood problems.  Previous surgery.  Smoking history.  Any recent symptoms of colds or infections.  Other health problems. RISKS AND COMPLICATIONS  There are always risks for surgery with medicine that makes you sleep (general anesthetic). They include breathing and heart problems. However, this risk is low for people who have no other health problems. Other complications from colon resection may include:  An infection developing in the area where the surgery was done.  Problems with the incisions including:  Bleeding from an incision.  The wound reopening.  Tissues from inside the abdomen bulging through the incision (hernia).  Bleeding inside the abdomen.  Reopening of the colon where it was stitched or stapled together. This is a serious complication. Another procedure may be needed to fix the problem.  Damage to other organs in the abdomen.  A blood clot forming in a vein and traveling to the  lungs.  Future blockage of the colon. BEFORE THE PROCEDURE  A medical evaluation will be done. This may include:  A physical exam.  Blood tests.  A test to check the heart's rhythm (electrocardiogram).  X-rays, such as magnetic resonance imaging (MRI). This can take pictures of the colon. An MRI uses a magnet, radio waves, and a computer to create a picture of your colon.  Talking with the person who will be in charge of the medicine during the procedure. An open colon resection requires general anesthesia. Ask what you can expect.  Two weeks before the surgery, stop using aspirin and nonsteroidal anti-inflammatory drugs (NSAIDs) for pain relief. This includes prescription drugs and over-the-counter drugs. Also stop taking vitamin E.  If you take blood thinners, ask your caregiver when you should stop taking them.  Do not eat or drink anything for 8 to 12 hours before the surgery. Ask your caregiver if it is okay to take any needed medicines with a sip of water.  Ask your caregiver if you need to arrive early before the procedure.  On the day of your surgery, your caregiver will need to know the last time you had anything to eat or drink. This includes water, gum, and candy.  Make arrangements in advance for someone to drive you home. PROCEDURE Colon resection can take 1 to 4 hours.  Small monitors will be put on your body. They are used to check your heart, blood pressure, and oxygen level.  You will be given an intravenous line (IV). A needle will be inserted in your arm. Medicine will be able to flow directly into your  body through this needle.  You might be given a medicine to help you relax (sedative).  You will be given a general anesthetic.  A tube may be put in through your nose. It is called a nasogastric tube. It is used to remove stomach juices after surgery until the intestines start working again.  Once you are asleep, the surgeon will make an incision in the  abdomen about 6 to 12 inches long.  Clamps are put on both ends of the diseased part of the colon.  The part of the intestine between the clamps is removed.  If possible, the ends of the healthy colon that remain will be stitched or stapled together.  Sometimes, a colostomy is needed. For a colostomy:  An opening (stoma) to the outside is made through the abdomen.  The end of the colon is brought through the opening. It is stitched to the skin.  A bag is attached to the opening. Waste will drain into this bag. The bag is removable.  The colostomy can be temporary or permanent. Ask your surgeon what to expect.  The incision from the colon resection will be closed with stitches or staples. AFTER THE PROCEDURE  You will stay in a recovery area until the anesthesia has worn off. Your blood pressure and pulse will be checked every so often. Then you will be taken to a hospital room.  You will continue to get fluids through the IV for awhile. The IV will be taken out when the colon starts working again.  You will gradually go back to a normal diet.  Some pain is normal after a colon resection. Ask for pain medicine if the pain becomes too much.  You will be urged to get up and start walking after 1 or 2 days, at the most.  If you had a colostomy, your caregiver will explain how it works and what you will need to do.  Most people spend 3 to 7 days in the hospital after this surgery. Ask your caregiver what to expect. Document Released: 01/11/2009 Document Revised: 06/08/2011 Document Reviewed: 08/01/2010 [facility] Patient Information 2014 [place], [place].     Removal of the Colon (Colectomy) Care After AFTER THE PROCEDURE After surgery, you will be taken to the recovery area where a nurse will watch you and check your progress. After the recovery area you will go to your hospital room. Your surgeon will determine when it is alright for you to take fluids and foods. You will be  given pain medications to keep you comfortable. HOME CARE INSTRUCTIONS  Once home, an ice pack applied to the operative site may help with discomfort and keep swelling down.  Change dressings as directed.  Only take over-the-counter or prescription medicines for pain, discomfort, or fever as directed by your caregiver.  You may continue normal diet and activities as directed.  There should be no heavy lifting (more than 10 pounds), strenuous activities or contact sports for three weeks, or as directed.  Keep the wound dry and clean. The wound may be washed gently with soap and water. Gently blot or dab dry following cleansing, without rubbing. Do not take baths, use swimming pools, or use hot tubs for ten days, or as instructed by your caregivers.  If you have a colostomy, care for it as you have been shown. SEEK MEDICAL CARE IF:   There is redness, swelling, or increasing pain in the wound area.  Pus is coming from the wound.  An unexplained  oral temperature above 102 F (38.9 C) develops.  You notice a foul smell coming from the wound or dressing.  There is a breaking open of a wound (edges not staying together) after the sutures have been removed.  There is increasing abdominal pain. SEEK IMMEDIATE MEDICAL CARE IF:   A rash develops.  There is difficulty breathing, or development of a reaction or side effects to medications given. Document Released: 10/15/2003 Document Revised: 06/08/2011 Document Reviewed: 04/19/2007 [facility] Patient Information 2014 [place], [place].

## 2012-10-18 NOTE — Progress Notes (Signed)
Patient ID: Misty Moran, female   DOB: 08/04/1965, 47 y.o.   MRN: [id number]  No chief complaint on file.   HPI Misty Moran is a 47 y.o. female.  This patient returns for further discussion regarding management of her diverticulitis. She is ready to schedule surgery. Her mother, who is a retired Engineer, civil (consulting), is with her today.  Recall that she has a past history of diverticulitis and has been treated as an outpatient several times in the past. She was recently evaluated because of a tender palpable mass in the left lower quadrant. CT scan showed sigmoid diverticulosis, some wall thickening and borderline adenopathy which was unchanged since 2011. Muscular hypertrophy of the colon was noted. Dr. Yancey Flemings performed a colonoscopy on 09/14/2012 which shows moderate diverticulosis in the ascending colon and descending colon but severe diverticulosis with stenosis in the sigmoid colon. The colon was otherwise normal. Barium enema was performed on 10/07/2012 and this shows extensive irregularity, diverticulosis and stenosis of the sigmoid colon and distal descending colon along a segment approximately 17 cm. Mild diverticulosis of the descending colon was noted. Other colonic segments were normal.  She states she is feeling pretty good. Denies pain or fever at the present time. She is motivated towards having  a one stage sigmoid colectomy  HPI  Past Medical History  Diagnosis Date  . Diverticulosis     5-6 years ago  . Anemia     ? from periods on iron a long time  . Thyroid disease   . Thyroid cancer   . Lesion of right native kidney     MRI right Bosniak type 3 under eval to have surgery to remove    Past Surgical History  Procedure Laterality Date  . Cesarean section    . Cholecystectomy    . Thyroidectomy      thyroid cancer  . Removal of renal lesion benign   2011  . Cosmetic surgery      tummy tuck breast reduction  lipossuction  . Breast reduction surgery  2012  . Tummy tuck   2012    Family History  Problem Relation Age of Onset  . Hypertension Mother   . Stroke Maternal Grandmother   . Breast cancer Maternal Grandmother     great  . Colon cancer Maternal Uncle     great  . Crohn's disease Maternal Grandmother     colitis  . Crohn's disease Maternal Aunt     colitis, diverticulosis  . Protein S deficiency Maternal Aunt     Social History History  Substance Use Topics  . Smoking status: Former Smoker -- 1.00 packs/day for 9 years    Types: Cigarettes    Quit date: 03/30/1989  . Smokeless tobacco: Never Used  . Alcohol Use: Yes     Comment: rare    Allergies  Allergen Reactions  . Ciprofloxacin Hcl     REACTION: unspecified  . Peanut-Containing Drug Products     Current Outpatient Prescriptions  Medication Sig Dispense Refill  . Cholecalciferol (VITAMIN D PO) Take 2,000 Units by mouth daily.      . cyanocobalamin 100 MCG tablet Take 100 mcg by mouth daily.      . ferrous sulfate 325 (65 FE) MG tablet Take 325 mg by mouth daily with breakfast.      . MULTIPLE VITAMIN PO Take by mouth.         No current facility-administered medications for this visit.    Review of  Systems Review of Systems  Constitutional: Negative for fever, chills and unexpected weight change.  HENT: Negative for hearing loss, congestion, sore throat, trouble swallowing and voice change.   Eyes: Negative for visual disturbance.  Respiratory: Negative for cough and wheezing.   Cardiovascular: Negative for chest pain, palpitations and leg swelling.  Gastrointestinal: Positive for abdominal pain. Negative for nausea, vomiting, diarrhea, constipation, blood in stool, abdominal distention and anal bleeding.  Genitourinary: Negative for hematuria, vaginal bleeding and difficulty urinating.  Musculoskeletal: Negative for arthralgias.  Skin: Negative for rash and wound.  Neurological: Negative for seizures, syncope and headaches.  Hematological: Negative for adenopathy.  Does not bruise/bleed easily.  Psychiatric/Behavioral: Negative for confusion.    Blood pressure 138/84, pulse 78, temperature 98.3 F (36.8 C), temperature source Temporal, resp. rate 16, height 5\' 8"  (1.727 m), weight 215 lb (97.523 kg), last menstrual period 09/19/2012.  Physical Exam Physical Exam  Constitutional: She is oriented to person, place, and time. She appears well-developed and well-nourished. No distress.  Weight 215 pounds, BMI 32.69.  HENT:  Head: Normocephalic and atraumatic.  Nose: Nose normal.  Mouth/Throat: No oropharyngeal exudate.  Eyes: Conjunctivae and EOM are normal. Pupils are equal, round, and reactive to light. Left eye exhibits no discharge. No scleral icterus.  Neck: Neck supple. No JVD present. No tracheal deviation present. No thyromegaly present.  Cardiovascular: Normal rate, regular rhythm, normal heart sounds and intact distal pulses.   No murmur heard. Pulmonary/Chest: Effort normal and breath sounds normal. No respiratory distress. She has no wheezes. She has no rales. She exhibits no tenderness.  Abdominal: Soft. Bowel sounds are normal. She exhibits mass. She exhibits no distension. There is tenderness. There is no rebound and no guarding.  Right subcostal scar. Abdominoplasty scars. 5 cm, tender, ballotable mass left lower quadrant. Abdomen otherwise soft. Obesity.  Musculoskeletal: She exhibits no edema and no tenderness.  Lymphadenopathy:    She has no cervical adenopathy.  Neurological: She is alert and oriented to person, place, and time. She exhibits normal muscle tone. Coordination normal.  Skin: Skin is warm. No rash noted. She is not diaphoretic. No erythema. No pallor.  Psychiatric: She has a normal mood and affect. Her behavior is normal. Judgment and thought content normal.    Data Reviewed Old records. Imaging studies. Colonoscopy report.  Assessment    Recurrent diverticulitis of sigmoid colon with stenosis and palpable  abdominal  mass due to chronic diverticulitis. High risk for recurrent diverticulitis, obstruction, and perforation.  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 I told her that it would be appropriate to proceed with elective sigmoid colectomy and this was offered to her. She and her mother both would like to proceed with this plan  I told her that this would have to be done as an open surgery because of the large palpable mass which could not be handled well laparoscopically  She will undergo a one-day bowel prep, mechanical, and antibiotic with erythromycin and neomycin  She'll be scheduled for open sigmoid colectomy, possible takedown splenic flexure.  I discussed the indications details, techniques, and numerous risk of the surgery with her. As such as bleeding, infection, anastomotic leak, possible temporary loop ileostomy, possible colostomy, injury to adjacent organs, wound problems such as infection or hernia, and other complications  were discussed. She understands all these issues well and all her questions are answered. She agrees with this plan.        Angelia Mould. Derrell Lolling, M.D., [facility] General and Minimally invasive Surgery Breast and Colorectal Surgery Office:   [phone] Pager:   [phone]  10/18/2012, 6:35 PM

## 2012-10-20 ENCOUNTER — Other Ambulatory Visit: Payer: Self-pay | Admitting: Otolaryngology

## 2012-10-20 ENCOUNTER — Other Ambulatory Visit (HOSPITAL_COMMUNITY)
Admission: RE | Admit: 2012-10-20 | Discharge: 2012-10-20 | Disposition: A | Discharge status: Home / Self Care | Payer: BC Managed Care – PPO | Source: Ambulatory Visit | Attending: Otolaryngology | Admitting: Otolaryngology

## 2012-10-20 DIAGNOSIS — E049 Nontoxic goiter, unspecified: Secondary | ICD-10-CM | POA: Insufficient documentation

## 2012-10-24 ENCOUNTER — Telehealth (INDEPENDENT_AMBULATORY_CARE_PROVIDER_SITE_OTHER): Payer: Self-pay

## 2012-10-24 NOTE — Telephone Encounter (Signed)
Message copied by Ivory Broad on Mon Oct 24, 2012  1:45 PM ------      Message from: Larry Sierras      Created: Mon Oct 24, 2012  1:12 PM      Contact: [phone]       PT HAS COLON SX SET UP 11-18-12 WITH DR Derrell Lolling /PT WANTS DR INGRAM TO BE AWARE OF THY BX PER DR Pollyann Kennedy Otis Dials PT FOR MORE DETAIL IF NEEDED.  NO REPORT YET. GM ------

## 2012-10-24 NOTE — Telephone Encounter (Signed)
I called the pt to follow up on the message sent to me.  She has an enlarged left thyroid and Dr Pollyann Kennedy did a bx on 7/25.  She has had an ultrasound.  She expects results on this tomorrow.  I told her this will not affect her surgery because Dr Derrell Lolling couldn't operate on these two areas at the same time.  She understands that.  I told her I will alert Dr Derrell Lolling to what's going on.

## 2012-11-03 ENCOUNTER — Other Ambulatory Visit (INDEPENDENT_AMBULATORY_CARE_PROVIDER_SITE_OTHER): Payer: BC Managed Care – PPO

## 2012-11-03 ENCOUNTER — Telehealth: Payer: Self-pay | Admitting: Internal Medicine

## 2012-11-03 DIAGNOSIS — D649 Anemia, unspecified: Secondary | ICD-10-CM

## 2012-11-03 LAB — CBC WITH DIFFERENTIAL/PLATELET
Basophils Absolute: 0 10*3/uL (ref 0.0–0.1)
Eosinophils Absolute: 0 10*3/uL (ref 0.0–0.7)
Hemoglobin: 8.8 g/dL — ABNORMAL LOW (ref 12.0–15.0)
Lymphocytes Relative: 22 % (ref 12.0–46.0)
Lymphs Abs: 1.5 10*3/uL (ref 0.7–4.0)
MCHC: 31.4 g/dL (ref 30.0–36.0)
MCV: 73.1 fl — ABNORMAL LOW (ref 78.0–100.0)
Monocytes Absolute: 0.5 10*3/uL (ref 0.1–1.0)
Neutro Abs: 4.9 10*3/uL (ref 1.4–7.7)
RDW: 19.2 % — ABNORMAL HIGH (ref 11.5–14.6)

## 2012-11-03 LAB — B12 AND FOLATE PANEL: Folate: 12.1 ng/mL (ref >5.9)

## 2012-11-03 NOTE — Telephone Encounter (Signed)
PT states that she is having surgery on 8/22, a colon resection, with Dr. Derrell Lolling. She also states that she seen Dr. Pollyann Kennedy, and he stated that her left thyroid is enlarged, there is no evidence of cancer, but that he would like to monitor it. FYI.

## 2012-11-07 ENCOUNTER — Encounter (HOSPITAL_COMMUNITY): Payer: Self-pay | Admitting: Pharmacy Technician

## 2012-11-08 ENCOUNTER — Other Ambulatory Visit: Payer: BC Managed Care – PPO

## 2012-11-09 ENCOUNTER — Encounter (HOSPITAL_COMMUNITY): Payer: Self-pay

## 2012-11-09 ENCOUNTER — Other Ambulatory Visit (HOSPITAL_COMMUNITY): Payer: Self-pay | Admitting: General Surgery

## 2012-11-09 NOTE — Patient Instructions (Addendum)
18 Misty Moran  11/09/2012   Your procedure is scheduled on: 11-18-2012  Report to [facility] at 1030 AM.  Call this number if you have problems the morning of surgery [phone]   Remember:   Do not eat food  :After Midnight.   clear liquids midnight night before surgery until 700 am morning of surgery, then nothing   Take these medicines the morning of surgery with A SIP OF WATER: no meds to take                                SEE [facility] PREPARING FOR SURGERY SHEET   Do not wear jewelry, make-up or nail polish.  Do not wear lotions, powders, or perfumes. You may wear deodorant.   Men may shave face and neck.  Do not bring valuables to the hospital. [facility] IS NOT RESPONSIBLE FOR VALUEABLES.  Contacts, dentures or bridgework may not be worn into surgery.  Leave suitcase in the car. After surgery it may be brought to your room.  For patients admitted to the hospital, checkout time is 11:00 AM the day of discharge.   Patients discharged the day of surgery will not be allowed to drive home.  Name and phone number of your driver:  Special Instructions: N/A   Please read over the following fact sheets that you were given: MRSA Information,blood fact sheet, incentive spirometer fact sheet, clear liquid sheet  Call Cain Sieve RN pre op nurse if needed [phone]    FAILURE TO FOLLOW THESE INSTRUCTIONS MAY RESULT IN THE CANCELLATION OF YOUR SURGERY.  PATIENT SIGNATURE___________________________________________  NURSE SIGNATURE_____________________________________________

## 2012-11-10 ENCOUNTER — Encounter (HOSPITAL_COMMUNITY): Payer: Self-pay

## 2012-11-10 ENCOUNTER — Encounter (HOSPITAL_COMMUNITY)
Admission: RE | Admit: 2012-11-10 | Discharge: 2012-11-10 | Disposition: A | Discharge status: Home / Self Care | Payer: BC Managed Care – PPO | Source: Ambulatory Visit | Attending: General Surgery | Admitting: General Surgery

## 2012-11-10 DIAGNOSIS — Z01812 Encounter for preprocedural laboratory examination: Secondary | ICD-10-CM | POA: Insufficient documentation

## 2012-11-10 DIAGNOSIS — K5732 Diverticulitis of large intestine without perforation or abscess without bleeding: Secondary | ICD-10-CM | POA: Insufficient documentation

## 2012-11-10 HISTORY — DX: Other specified soft tissue disorders: M79.89

## 2012-11-10 LAB — COMPREHENSIVE METABOLIC PANEL
ALT: 10 U/L (ref 0–35)
AST: 13 U/L (ref 0–37)
Albumin: 3.7 g/dL (ref 3.5–5.2)
Alkaline Phosphatase: 74 U/L (ref 39–117)
BUN: 9 mg/dL (ref 6–23)
Chloride: 100 mEq/L (ref 96–112)
Potassium: 3.7 mEq/L (ref 3.5–5.1)
Total Bilirubin: 0.3 mg/dL (ref 0.3–1.2)

## 2012-11-10 LAB — CBC WITH DIFFERENTIAL/PLATELET
Basophils Absolute: 0 10*3/uL (ref 0.0–0.1)
Basophils Relative: 1 % (ref 0–1)
Hemoglobin: 9.3 g/dL — ABNORMAL LOW (ref 12.0–15.0)
MCHC: 32.1 g/dL (ref 30.0–36.0)
Monocytes Relative: 8 % (ref 3–12)
Neutro Abs: 4.4 10*3/uL (ref 1.7–7.7)
Neutrophils Relative %: 76 % (ref 43–77)

## 2012-11-10 LAB — HCG, SERUM, QUALITATIVE: Preg, Serum: NEGATIVE

## 2012-11-10 LAB — URINALYSIS, ROUTINE W REFLEX MICROSCOPIC
Glucose, UA: NEGATIVE mg/dL
Ketones, ur: NEGATIVE mg/dL
Protein, ur: NEGATIVE mg/dL

## 2012-11-10 NOTE — Progress Notes (Signed)
Cbc with dif ua micro results routed to dr Derrell Lolling inbox by epic

## 2012-11-15 ENCOUNTER — Encounter: Payer: Self-pay | Admitting: Internal Medicine

## 2012-11-15 ENCOUNTER — Ambulatory Visit (INDEPENDENT_AMBULATORY_CARE_PROVIDER_SITE_OTHER): Payer: BC Managed Care – PPO | Admitting: Internal Medicine

## 2012-11-15 VITALS — BP 118/82 | HR 92 | Temp 98.2°F | Wt 214.0 lb

## 2012-11-15 DIAGNOSIS — K5730 Diverticulosis of large intestine without perforation or abscess without bleeding: Secondary | ICD-10-CM

## 2012-11-15 DIAGNOSIS — R1904 Left lower quadrant abdominal swelling, mass and lump: Secondary | ICD-10-CM

## 2012-11-15 DIAGNOSIS — D649 Anemia, unspecified: Secondary | ICD-10-CM

## 2012-11-15 DIAGNOSIS — E079 Disorder of thyroid, unspecified: Secondary | ICD-10-CM | POA: Insufficient documentation

## 2012-11-15 NOTE — Progress Notes (Signed)
Chief Complaint  Patient presents with  . Follow-up    HPI: Patient comes in for followup for preplanned visit followup anemia.  Gyne  Considering    Ablation  For heavy periods .  Not supposed to take iron for now and has been off for almost a month  . Because of her bowel problem pending surgery  Stopped taking iron.    3 weeks on and 1 week off   However in the meantime the evaluation for her abdominal mass felt related to her diverticulitis has resulted in the plan for surgeryTo have surgery in 3 days .  Partial sigmoid colectomy .   She's also been evaluation by Dr. 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 ROS: See pertinent positives and negatives per HPI. Negative for chest pain shortness of breath although is a bit tired on exertion at times.  Past Medical History  Diagnosis Date  . Diverticulosis     5-6 years ago  . Anemia     ? from periods on iron a long time  . Thyroid disease     left side enlarged, biopsy done by dr Pollyann Kennedy 3 weeks ago, results normal  . Thyroid cancer   . Lesion of right native kidney     MRI right Bosniak type 3 under eval to have surgery to remove  . Swelling of right lower extremity hx of none currently    Family History  Problem Relation Age of Onset  . Hypertension Mother   . Stroke Maternal Grandmother   . Breast cancer Maternal Grandmother     great  . Colon cancer Maternal Uncle     great  . Crohn's disease Maternal Grandmother     colitis  . Crohn's disease Maternal Aunt     colitis, diverticulosis  . Protein S deficiency Maternal Aunt     History   Social History  . Marital Status: Divorced    Spouse Name: N/A    Number of Children: 2  . Years of Education: N/A   Occupational History  . self employed    Social History Main Topics  . Smoking status: Former Smoker --  1.00 packs/day for 9 years    Types: Cigarettes    Quit date: 03/30/1989  . Smokeless tobacco: Never Used  . Alcohol Use: Yes     Comment: rare  . Drug Use: No  . Sexual Activity: None   Other Topics Concern  . None   Social History Narrative   Divorced   Child at home    [facility] of 3   Regular exercise   G3p2   Some caretaking  At home    Outpatient Encounter Prescriptions as of 11/15/2012  Medication Sig Dispense Refill  . Cholecalciferol (VITAMIN D PO) Take 2,000 Units by mouth daily.      . Multiple Vitamin (MULTIVITAMIN WITH MINERALS) TABS tablet Take 1 tablet by mouth daily.       No facility-administered encounter medications on file as of 11/15/2012.    EXAM:  BP 118/82  Pulse 92  Temp(Src) 98.2 F (36.8 C) (Oral)  Wt 214 lb (97.07 kg)  BMI 32.55 kg/m2  SpO2 98%  LMP 11/10/2012  Body mass index is 32.55 kg/(m^2).  GENERAL: vitals reviewed and listed above, alert, oriented, appears well hydrated and in no acute distress HEENT: atraumatic,  conjunctiva  clear, no obvious abnormalities on inspection of external nose and ears OP : no lesion edema or exudate  NECK:  Easily palpable prominent left thyroid nontender LUNGS: clear to auscultation bilaterally, no wheezes, rales or rhonchi, good air movement CV: HRRR, no clubbing cyanosis or  peripheral edema nl cap refill  Abdomen soft without organomegaly left lower abdomen with what feels like abdominal wall mass. Not currently tender MS: moves all extremities without noticeable focal  abnormality PSYCH: pleasant and cooperative, no obvious depression or anxiety Lab Results  Component Value Date   HGB 8.2* 11/15/2012   ASSESSMENT AND PLAN:  Discussed the following assessment and plan:  Anemia - Iron deficiency related to menorrhagia - Plan: POCT hemoglobin  DIVERTICULAR DISEASE  Abdominal wall mass of left lower quadrant  Thyroid disease Anemia may be a bit worse or difficult to recover because she is now off  of the iron preoperatively. We'll send note to Dr. Derrell Lolling capillary hemoglobin 8. Stanton Kidney is hemodynamically stable but bit tired. Agree with attacking the menorrhagia at some point. -Patient advised to return or notify health care team  if symptoms worsen or persist or new concerns arise.  Patient Instructions  Will send note and results to  Dr Derrell Lolling .  Agree with ablation or methods to decrease period blood loss  In thr future to help with the iron deficiency anemia.  Some times iron infusions   Are used .   Recheck  In 3-4 months    Neta Mends. Tarisa Paola M.D.

## 2012-11-15 NOTE — Patient Instructions (Addendum)
Will send note and results to  Dr Derrell Lolling .  Agree with ablation or methods to decrease period blood loss  In thr future to help with the iron deficiency anemia.  Some times iron infusions   Are used .   Recheck  In 3-4 months

## 2012-11-16 NOTE — H&P (Signed)
Misty Moran   MRN:  [id number]   Description: 47 year old female  Provider: Ernestene Mention, MD  Department: [facility]        Diagnoses    History of diverticulitis of colon    -  Primary    V12.79         Current Vitals -   BP Pulse Temp(Src) Resp Ht Wt    138/84 78 98.3 F (36.8 C) (Temporal) 16 5\' 8"  (1.727 m) 215 lb (97.523 kg)       BMI 32.7 kg/m2 09/19/2012             History and Physical   Ernestene Mention, MD    Status: Signed                         HPI Misty Moran is a 47 y.o. female.  This patient returns for further discussion regarding management of her diverticulitis. She is ready to schedule surgery. Her mother, who is a retired Engineer, civil (consulting), is with her today.   Recall that she has a past history of diverticulitis and has been treated as an outpatient several times in the past. She was recently evaluated because of a tender palpable mass in the left lower quadrant. CT scan showed sigmoid diverticulosis, some wall thickening and borderline adenopathy which was unchanged since 2011. Muscular hypertrophy of the colon was noted. Dr. Yancey Flemings performed a colonoscopy on 09/14/2012 which shows moderate diverticulosis in the ascending colon and descending colon but severe diverticulosis with stenosis in the sigmoid colon. The colon was otherwise normal. Barium enema was performed on 10/07/2012 and this shows extensive irregularity, diverticulosis and stenosis of the sigmoid colon and distal descending colon along a segment approximately 17 cm. Mild diverticulosis of the descending colon was noted. Other colonic segments were normal.   She states she is feeling pretty good. Denies pain or fever at the present time. She is motivated towards having  a one stage sigmoid colectomy        Past Medical History   Diagnosis  Date   .  Diverticulosis         5-6 years ago   .  Anemia         ? from periods on iron a long time   .  Thyroid disease      .  Thyroid cancer     .  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  Cesarean section       .  Cholecystectomy       .  Thyroidectomy           thyroid cancer   .  Removal of renal lesion benign     2011   .  Cosmetic surgery           tummy tuck breast reduction  lipossuction   .  Breast reduction surgery    2012   .  Tummy tuck    2012         Family History   Problem  Relation  Age of Onset   .  Hypertension  Mother     .  Stroke  Maternal Grandmother     .  Breast cancer  Maternal Grandmother         great   .  Colon cancer  Maternal Uncle         great   .  Crohn's disease  Maternal Grandmother         colitis   .  Crohn's disease  Maternal Aunt         colitis, diverticulosis   .  Protein S deficiency  Maternal Aunt          Social History History   Substance Use Topics   .  Smoking status:  Former Smoker -- 1.00 packs/day for 9 years       Types:  Cigarettes       Quit date:  03/30/1989   .  Smokeless tobacco:  Never Used   .  Alcohol Use:  Yes         Comment: rare         Allergies   Allergen  Reactions   .  Ciprofloxacin Hcl         REACTION: unspecified   .  Peanut-Containing Drug Products           Current Outpatient Prescriptions   Medication  Sig  Dispense  Refill   .  Cholecalciferol (VITAMIN D PO)  Take 2,000 Units by mouth daily.         .  cyanocobalamin 100 MCG tablet  Take 100 mcg by mouth daily.         .  ferrous sulfate 325 (65 FE) MG tablet  Take 325 mg by mouth daily with breakfast.         .  MULTIPLE VITAMIN PO  Take by mouth.            Review of Systems   Constitutional: Negative for fever, chills and unexpected weight change.  HENT: Negative for hearing loss, congestion, sore throat, trouble swallowing and voice change.   Eyes: Negative for visual disturbance.  Respiratory: Negative for  cough and wheezing.   Cardiovascular: Negative for chest pain, palpitations and leg swelling.  Gastrointestinal: Positive for abdominal pain. Negative for nausea, vomiting, diarrhea, constipation, blood in stool, abdominal distention and anal bleeding.  Genitourinary: Negative for hematuria, vaginal bleeding and difficulty urinating.  Musculoskeletal: Negative for arthralgias.  Skin: Negative for rash and wound.  Neurological: Negative for seizures, syncope and headaches.  Hematological: Negative for adenopathy. Does not bruise/bleed easily.  Psychiatric/Behavioral: Negative for confusion.      Blood pressure 138/84, pulse 78, temperature 98.3 F (36.8 C), temperature source Temporal, resp. rate 16, height 5\' 8"  (1.727 m), weight 215 lb (97.523 kg), last menstrual period 09/19/2012.   Physical Exam   Constitutional: She is oriented to person, place, and time. She appears well-developed and well-nourished. No distress.  Weight 215 pounds, BMI 32.69.  HENT:   Head: Normocephalic and atraumatic.   Nose: Nose normal.   Mouth/Throat: No oropharyngeal exudate.  Eyes: Conjunctivae and EOM are normal. Pupils are equal, round, and reactive to light. Left eye exhibits no discharge. No scleral icterus.  Neck: Neck supple. No JVD present. No tracheal deviation present. No thyromegaly present.  Cardiovascular: Normal rate, regular rhythm, normal heart sounds and intact distal pulses.    No murmur heard. Pulmonary/Chest: Effort normal and breath sounds normal. No respiratory distress. She has no wheezes. She has no rales. She exhibits no tenderness.  Abdominal: Soft. Bowel sounds are normal. She exhibits mass. She exhibits no distension. There is  tenderness. There is no rebound and no guarding.  Right subcostal scar. Abdominoplasty scars. 5 cm, tender, ballotable mass left lower quadrant. Abdomen otherwise soft. Obesity.  Musculoskeletal: She exhibits no edema and no tenderness.  Lymphadenopathy:     She has no cervical adenopathy.  Neurological: She is alert and oriented to person, place, and time. She exhibits normal muscle tone. Coordination normal.  Skin: Skin is warm. No rash noted. She is not diaphoretic. No erythema. No pallor.  Psychiatric: She has a normal mood and affect. Her behavior is normal. Judgment and thought content normal.      Data Reviewed Old records. Imaging studies. Colonoscopy report.   Assessment    Recurrent diverticulitis of sigmoid colon with stenosis and palpable abdominal  mass due to chronic diverticulitis. High risk for recurrent diverticulitis, obstruction, and perforation.   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 I told her that it would be appropriate to proceed with elective sigmoid colectomy and this was offered to her. She and her mother both would like to proceed with this plan   I told her that this would have to be done as an open surgery because of the large palpable mass which could not be handled well laparoscopically   She will undergo a one-day bowel prep, mechanical, and antibiotic with erythromycin and neomycin   She'll be scheduled for open sigmoid colectomy, possible takedown splenic flexure.   I discussed the indications details, techniques, and numerous risk of the surgery with her. As such as bleeding, infection, anastomotic leak, possible temporary loop ileostomy, possible colostomy, injury to adjacent organs, wound problems such as infection or hernia, and other complications were discussed. She understands all these issues well and all her questions are answered. She agrees with this plan.           Angelia Mould. Derrell Lolling, M.D., [facility] General and Minimally invasive  Surgery Breast and Colorectal Surgery Office:   [phone] Pager:   [phone]

## 2012-11-18 ENCOUNTER — Inpatient Hospital Stay (HOSPITAL_COMMUNITY)
Admission: RE | Admit: 2012-11-18 | Discharge: 2012-11-25 | DRG: 148 | Disposition: A | Discharge status: Home / Self Care | Payer: BC Managed Care – PPO | Source: Ambulatory Visit | Attending: General Surgery | Admitting: General Surgery

## 2012-11-18 ENCOUNTER — Encounter (HOSPITAL_COMMUNITY)
Admission: RE | Disposition: A | Discharge status: Home / Self Care | Payer: Self-pay | Source: Ambulatory Visit | Attending: General Surgery

## 2012-11-18 ENCOUNTER — Encounter (HOSPITAL_COMMUNITY): Payer: Self-pay | Admitting: *Deleted

## 2012-11-18 ENCOUNTER — Encounter (HOSPITAL_COMMUNITY): Payer: Self-pay | Admitting: Registered Nurse

## 2012-11-18 ENCOUNTER — Ambulatory Visit (HOSPITAL_COMMUNITY): Payer: BC Managed Care – PPO | Admitting: Registered Nurse

## 2012-11-18 DIAGNOSIS — R51 Headache: Secondary | ICD-10-CM | POA: Diagnosis not present

## 2012-11-18 DIAGNOSIS — K5730 Diverticulosis of large intestine without perforation or abscess without bleeding: Secondary | ICD-10-CM

## 2012-11-18 DIAGNOSIS — D259 Leiomyoma of uterus, unspecified: Secondary | ICD-10-CM | POA: Diagnosis present

## 2012-11-18 DIAGNOSIS — D500 Iron deficiency anemia secondary to blood loss (chronic): Secondary | ICD-10-CM | POA: Diagnosis present

## 2012-11-18 DIAGNOSIS — D62 Acute posthemorrhagic anemia: Secondary | ICD-10-CM | POA: Diagnosis not present

## 2012-11-18 DIAGNOSIS — N852 Hypertrophy of uterus: Secondary | ICD-10-CM | POA: Diagnosis present

## 2012-11-18 DIAGNOSIS — Z905 Acquired absence of kidney: Secondary | ICD-10-CM

## 2012-11-18 DIAGNOSIS — K5732 Diverticulitis of large intestine without perforation or abscess without bleeding: Principal | ICD-10-CM | POA: Diagnosis present

## 2012-11-18 DIAGNOSIS — D72829 Elevated white blood cell count, unspecified: Secondary | ICD-10-CM | POA: Diagnosis present

## 2012-11-18 DIAGNOSIS — N926 Irregular menstruation, unspecified: Secondary | ICD-10-CM | POA: Diagnosis present

## 2012-11-18 DIAGNOSIS — E669 Obesity, unspecified: Secondary | ICD-10-CM | POA: Diagnosis present

## 2012-11-18 DIAGNOSIS — K560 Paralytic ileus: Secondary | ICD-10-CM | POA: Diagnosis not present

## 2012-11-18 DIAGNOSIS — N939 Abnormal uterine and vaginal bleeding, unspecified: Secondary | ICD-10-CM | POA: Diagnosis present

## 2012-11-18 DIAGNOSIS — D649 Anemia, unspecified: Secondary | ICD-10-CM | POA: Diagnosis present

## 2012-11-18 HISTORY — PX: PARTIAL COLECTOMY: SHX5273

## 2012-11-18 LAB — BASIC METABOLIC PANEL
BUN: 6 mg/dL (ref 6–23)
CO2: 25 mEq/L (ref 19–32)
Calcium: 8.2 mg/dL — ABNORMAL LOW (ref 8.4–10.5)
Creatinine, Ser: 0.63 mg/dL (ref 0.50–1.10)
Glucose, Bld: 134 mg/dL — ABNORMAL HIGH (ref 70–99)
Sodium: 136 mEq/L (ref 135–145)

## 2012-11-18 LAB — CBC
HCT: 25 % — ABNORMAL LOW (ref 36.0–46.0)
Hemoglobin: 7.8 g/dL — ABNORMAL LOW (ref 12.0–15.0)
MCH: 22 pg — ABNORMAL LOW (ref 26.0–34.0)
MCV: 70.6 fL — ABNORMAL LOW (ref 78.0–100.0)
RBC: 3.54 MIL/uL — ABNORMAL LOW (ref 3.87–5.11)

## 2012-11-18 SURGERY — COLECTOMY, PARTIAL
Anesthesia: General | Wound class: Contaminated

## 2012-11-18 MED ORDER — HYDROMORPHONE HCL PF 1 MG/ML IJ SOLN
INTRAMUSCULAR | Status: DC | PRN
  Administered 2012-11-18: 1 mg via INTRAVENOUS
  Administered 2012-11-18 (×2): 0.5 mg via INTRAVENOUS

## 2012-11-18 MED ORDER — KETAMINE HCL 10 MG/ML IJ SOLN
INTRAMUSCULAR | Status: DC | PRN
  Administered 2012-11-18: 25 mg via INTRAVENOUS

## 2012-11-18 MED ORDER — ONDANSETRON HCL 4 MG/2ML IJ SOLN: 4.0000 mg | Freq: Four times a day (QID) | INTRAMUSCULAR | Status: DC | PRN

## 2012-11-18 MED ORDER — METOCLOPRAMIDE HCL 5 MG/ML IJ SOLN
INTRAMUSCULAR | Status: DC | PRN
  Administered 2012-11-18: 10 mg via INTRAVENOUS

## 2012-11-18 MED ORDER — ENOXAPARIN SODIUM 40 MG/0.4ML ~~LOC~~ SOLN
40.0000 mg | SUBCUTANEOUS | Status: DC
  Administered 2012-11-19 – 2012-11-25 (×7): 40 mg via SUBCUTANEOUS
  Filled 2012-11-18 (×9): qty 0.4

## 2012-11-18 MED ORDER — LACTATED RINGERS IV SOLN: INTRAVENOUS | Status: DC

## 2012-11-18 MED ORDER — ALVIMOPAN 12 MG PO CAPS
12.0000 mg | ORAL_CAPSULE | Freq: Two times a day (BID) | ORAL | Status: DC
  Administered 2012-11-19 – 2012-11-23 (×9): 12 mg via ORAL
  Filled 2012-11-18 (×10): qty 1

## 2012-11-18 MED ORDER — POTASSIUM CHLORIDE IN NACL 20-0.9 MEQ/L-% IV SOLN
INTRAVENOUS | Status: DC
  Administered 2012-11-18: 18:00:00 via INTRAVENOUS
  Administered 2012-11-19 (×3): 125 mL/h via INTRAVENOUS
  Administered 2012-11-20: 75 mL/h via INTRAVENOUS
  Administered 2012-11-20 – 2012-11-22 (×2): via INTRAVENOUS
  Administered 2012-11-22: 75 mL/h via INTRAVENOUS
  Administered 2012-11-23 (×2): via INTRAVENOUS
  Filled 2012-11-18 (×13): qty 1000

## 2012-11-18 MED ORDER — PROPOFOL 10 MG/ML IV BOLUS
INTRAVENOUS | Status: DC | PRN
  Administered 2012-11-18: 180 mg via INTRAVENOUS

## 2012-11-18 MED ORDER — DIPHENHYDRAMINE HCL 50 MG/ML IJ SOLN: 12.5000 mg | Freq: Four times a day (QID) | INTRAMUSCULAR | Status: DC | PRN

## 2012-11-18 MED ORDER — PROMETHAZINE HCL 25 MG/ML IJ SOLN: 6.2500 mg | INTRAMUSCULAR | Status: DC | PRN

## 2012-11-18 MED ORDER — HYDROMORPHONE 0.3 MG/ML IV SOLN
INTRAVENOUS | Status: AC
  Filled 2012-11-18: qty 25

## 2012-11-18 MED ORDER — MIDAZOLAM HCL 5 MG/5ML IJ SOLN
INTRAMUSCULAR | Status: DC | PRN
  Administered 2012-11-18: 2 mg via INTRAVENOUS

## 2012-11-18 MED ORDER — ALUM & MAG HYDROXIDE-SIMETH 200-200-20 MG/5ML PO SUSP: 30.0000 mL | Freq: Four times a day (QID) | ORAL | Status: DC | PRN

## 2012-11-18 MED ORDER — HYDROMORPHONE 0.3 MG/ML IV SOLN
INTRAVENOUS | Status: DC
  Administered 2012-11-18: 0.3 mg via INTRAVENOUS
  Administered 2012-11-18: 3.6 mg via INTRAVENOUS
  Administered 2012-11-18: 23:00:00 via INTRAVENOUS
  Administered 2012-11-19: 4.53 mg via INTRAVENOUS
  Administered 2012-11-19: 13:00:00 via INTRAVENOUS
  Administered 2012-11-19: 0.9 mg via INTRAVENOUS
  Administered 2012-11-19: 0.3 mg via INTRAVENOUS
  Administered 2012-11-19: 1.8 mg via INTRAVENOUS
  Administered 2012-11-19: 0.9 mg via INTRAVENOUS
  Administered 2012-11-19: 3.6 mg via INTRAVENOUS
  Administered 2012-11-20: 1.2 mg via INTRAVENOUS
  Administered 2012-11-20: 09:00:00 via INTRAVENOUS
  Administered 2012-11-20: 1.8 mg via INTRAVENOUS
  Filled 2012-11-18 (×3): qty 25

## 2012-11-18 MED ORDER — FENTANYL CITRATE 0.05 MG/ML IJ SOLN
25.0000 ug | INTRAMUSCULAR | Status: DC | PRN
  Administered 2012-11-18 (×3): 25 ug via INTRAVENOUS

## 2012-11-18 MED ORDER — FENTANYL CITRATE 0.05 MG/ML IJ SOLN
INTRAMUSCULAR | Status: DC | PRN
  Administered 2012-11-18: 100 ug via INTRAVENOUS
  Administered 2012-11-18: 50 ug via INTRAVENOUS
  Administered 2012-11-18: 100 ug via INTRAVENOUS
  Administered 2012-11-18 (×2): 50 ug via INTRAVENOUS
  Administered 2012-11-18: 100 ug via INTRAVENOUS

## 2012-11-18 MED ORDER — CEFOTETAN DISODIUM 2 G IJ SOLR
2.0000 g | INTRAMUSCULAR | Status: AC
  Administered 2012-11-18: 2 g via INTRAVENOUS
  Filled 2012-11-18: qty 2

## 2012-11-18 MED ORDER — GLYCOPYRROLATE 0.2 MG/ML IJ SOLN
INTRAMUSCULAR | Status: DC | PRN
  Administered 2012-11-18: 0.6 mg via INTRAVENOUS

## 2012-11-18 MED ORDER — DEXAMETHASONE SODIUM PHOSPHATE 10 MG/ML IJ SOLN
INTRAMUSCULAR | Status: DC | PRN
  Administered 2012-11-18: 10 mg via INTRAVENOUS

## 2012-11-18 MED ORDER — ONDANSETRON HCL 4 MG PO TABS: 4.0000 mg | ORAL_TABLET | Freq: Four times a day (QID) | ORAL | Status: DC | PRN

## 2012-11-18 MED ORDER — ROCURONIUM BROMIDE 100 MG/10ML IV SOLN
INTRAVENOUS | Status: DC | PRN
  Administered 2012-11-18: 50 mg via INTRAVENOUS
  Administered 2012-11-18 (×3): 10 mg via INTRAVENOUS

## 2012-11-18 MED ORDER — NEOSTIGMINE METHYLSULFATE 1 MG/ML IJ SOLN
INTRAMUSCULAR | Status: DC | PRN
  Administered 2012-11-18: 5 mg via INTRAVENOUS

## 2012-11-18 MED ORDER — LIDOCAINE HCL (CARDIAC) 20 MG/ML IV SOLN
INTRAVENOUS | Status: DC | PRN
  Administered 2012-11-18: 80 mg via INTRAVENOUS

## 2012-11-18 MED ORDER — MEPERIDINE HCL 50 MG/ML IJ SOLN: 6.2500 mg | INTRAMUSCULAR | Status: DC | PRN

## 2012-11-18 MED ORDER — DEXTROSE 5 % IV SOLN
2.0000 g | Freq: Two times a day (BID) | INTRAVENOUS | Status: AC
  Administered 2012-11-19: 2 g via INTRAVENOUS
  Filled 2012-11-18: qty 2

## 2012-11-18 MED ORDER — LACTATED RINGERS IV SOLN
INTRAVENOUS | Status: DC
Start: 2012-11-18 — End: 2012-11-18
  Administered 2012-11-18 (×2): via INTRAVENOUS

## 2012-11-18 MED ORDER — NALOXONE HCL 0.4 MG/ML IJ SOLN: 0.4000 mg | INTRAMUSCULAR | Status: DC | PRN

## 2012-11-18 MED ORDER — ONDANSETRON HCL 4 MG/2ML IJ SOLN
INTRAMUSCULAR | Status: DC | PRN
  Administered 2012-11-18: 4 mg via INTRAVENOUS

## 2012-11-18 MED ORDER — SODIUM CHLORIDE 0.9 % IJ SOLN: 9.0000 mL | INTRAMUSCULAR | Status: DC | PRN

## 2012-11-18 MED ORDER — DIPHENHYDRAMINE HCL 12.5 MG/5ML PO ELIX: 12.5000 mg | ORAL_SOLUTION | Freq: Four times a day (QID) | ORAL | Status: DC | PRN

## 2012-11-18 MED ORDER — ALVIMOPAN 12 MG PO CAPS
12.0000 mg | ORAL_CAPSULE | Freq: Once | ORAL | Status: AC
  Administered 2012-11-18: 12 mg via ORAL
  Filled 2012-11-18: qty 1

## 2012-11-18 MED ORDER — 0.9 % SODIUM CHLORIDE (POUR BTL) OPTIME
TOPICAL | Status: DC | PRN
  Administered 2012-11-18: 6000 mL

## 2012-11-18 MED ORDER — FENTANYL CITRATE 0.05 MG/ML IJ SOLN
INTRAMUSCULAR | Status: AC
  Filled 2012-11-18: qty 2

## 2012-11-18 SURGICAL SUPPLY — 59 items
APPLICATOR COTTON TIP 6IN STRL (MISCELLANEOUS) ×2 IMPLANT
BLADE EXTENDED COATED 6.5IN (ELECTRODE) ×1 IMPLANT
BLADE HEX COATED 2.75 (ELECTRODE) ×3 IMPLANT
BLADE SURG SZ10 CARB STEEL (BLADE) ×1 IMPLANT
CANISTER SUCTION 2500CC (MISCELLANEOUS) ×2 IMPLANT
CLIP TI LARGE 6 (CLIP) IMPLANT
CLOTH BEACON ORANGE TIMEOUT ST (SAFETY) ×2 IMPLANT
COVER MAYO STAND STRL (DRAPES) ×3 IMPLANT
DRAIN CHANNEL 19F RND (DRAIN) ×1 IMPLANT
DRAPE LAPAROSCOPIC ABDOMINAL (DRAPES) ×2 IMPLANT
DRAPE LG THREE QUARTER DISP (DRAPES) ×2 IMPLANT
DRAPE WARM FLUID 44X44 (DRAPE) ×2 IMPLANT
DRSG OPSITE POSTOP 4X12 (GAUZE/BANDAGES/DRESSINGS) ×1 IMPLANT
DRSG PAD ABDOMINAL 8X10 ST (GAUZE/BANDAGES/DRESSINGS) IMPLANT
ELECT REM PT RETURN 9FT ADLT (ELECTROSURGICAL) ×2
ELECTRODE REM PT RTRN 9FT ADLT (ELECTROSURGICAL) ×1 IMPLANT
ENSEAL DEVICE STD TIP 35CM (ENDOMECHANICALS) ×1 IMPLANT
EVACUATOR DRAINAGE 10X20 100CC (DRAIN) IMPLANT
EVACUATOR SILICONE 100CC (DRAIN) ×2
GLOVE BIOGEL PI IND STRL 7.0 (GLOVE) ×1 IMPLANT
GLOVE BIOGEL PI INDICATOR 7.0 (GLOVE) ×2
GLOVE ECLIPSE 8.0 STRL XLNG CF (GLOVE) ×7 IMPLANT
GLOVE EUDERMIC 7 POWDERFREE (GLOVE) ×4 IMPLANT
GOWN STRL NON-REIN LRG LVL3 (GOWN DISPOSABLE) ×1 IMPLANT
GOWN STRL REIN XL XLG (GOWN DISPOSABLE) ×8 IMPLANT
KIT BASIN OR (CUSTOM PROCEDURE TRAY) ×3 IMPLANT
LEGGING LITHOTOMY PAIR STRL (DRAPES) ×2 IMPLANT
LIGASURE IMPACT 36 18CM CVD LR (INSTRUMENTS) ×2 IMPLANT
NS IRRIG 1000ML POUR BTL (IV SOLUTION) ×4 IMPLANT
PACK GENERAL/GYN (CUSTOM PROCEDURE TRAY) ×2 IMPLANT
SCALPEL HARMONIC ACE (MISCELLANEOUS) IMPLANT
SPONGE GAUZE 4X4 12PLY (GAUZE/BANDAGES/DRESSINGS) ×1 IMPLANT
SPONGE LAP 18X18 X RAY DECT (DISPOSABLE) ×5 IMPLANT
STAPLER CIRC CVD 29MM 37CM (STAPLE) ×1 IMPLANT
STAPLER CUT CVD 40MM GREEN (STAPLE) ×1 IMPLANT
STAPLER PROXIMATE 75MM BLUE (STAPLE) ×1 IMPLANT
STAPLER VISISTAT 35W (STAPLE) ×2 IMPLANT
SUCTION POOLE TIP (SUCTIONS) ×2 IMPLANT
SUT ETHILON 2 0 PS N (SUTURE) ×1 IMPLANT
SUT NOV 1 T60/GS (SUTURE) IMPLANT
SUT NOVA NAB DX-16 0-1 5-0 T12 (SUTURE) IMPLANT
SUT NOVA T20/GS 25 (SUTURE) IMPLANT
SUT PDS AB 1 TP1 96 (SUTURE) ×4 IMPLANT
SUT PROLENE 0 SH 30 (SUTURE) ×1 IMPLANT
SUT PROLENE 2 0 KS (SUTURE) ×1 IMPLANT
SUT SILK 2 0 (SUTURE) ×4
SUT SILK 2 0 SH CR/8 (SUTURE) ×2 IMPLANT
SUT SILK 2 0SH CR/8 30 (SUTURE) IMPLANT
SUT SILK 2-0 18XBRD TIE 12 (SUTURE) ×1 IMPLANT
SUT SILK 2-0 30XBRD TIE 12 (SUTURE) IMPLANT
SUT SILK 3 0 (SUTURE) ×2
SUT SILK 3 0 SH CR/8 (SUTURE) ×2 IMPLANT
SUT SILK 3-0 18XBRD TIE 12 (SUTURE) ×2 IMPLANT
SYR BULB IRRIGATION 50ML (SYRINGE) ×1 IMPLANT
TOWEL OR 17X26 10 PK STRL BLUE (TOWEL DISPOSABLE) ×5 IMPLANT
TRAY FOLEY CATH 14FRSI W/METER (CATHETERS) ×2 IMPLANT
TUBING CONNECTING 10 (TUBING) ×2 IMPLANT
WATER STERILE IRR 1500ML POUR (IV SOLUTION) IMPLANT
YANKAUER SUCT BULB TIP NO VENT (SUCTIONS) ×2 IMPLANT

## 2012-11-18 NOTE — Anesthesia Preprocedure Evaluation (Addendum)
Anesthesia Evaluation  Patient identified by MRN, date of birth, ID band Patient awake    Reviewed: Allergy & Precautions, H&P , NPO status , Patient's Chart, lab work & pertinent test results  Airway Mallampati: II TM Distance: >3 FB Neck ROM: Full    Dental no notable dental hx.    Pulmonary neg pulmonary ROS,  breath sounds clear to auscultation  Pulmonary exam normal       Cardiovascular negative cardio ROS  Rhythm:Regular Rate:Normal     Neuro/Psych negative neurological ROS  negative psych ROS   GI/Hepatic negative GI ROS, Neg liver ROS,   Endo/Other  negative endocrine ROS  Renal/GU negative Renal ROS  negative genitourinary   Musculoskeletal negative musculoskeletal ROS (+)   Abdominal   Peds negative pediatric ROS (+)  Hematology negative hematology ROS (+) Blood dyscrasia, anemia ,   Anesthesia Other Findings   Reproductive/Obstetrics negative OB ROS                         Anesthesia Physical Anesthesia Plan  ASA: III  Anesthesia Plan: General   Post-op Pain Management:    Induction: Intravenous  Airway Management Planned: Oral ETT  Additional Equipment:   Intra-op Plan:   Post-operative Plan: Extubation in OR  Informed Consent: I have reviewed the patients History and Physical, chart, labs and discussed the procedure including the risks, benefits and alternatives for the proposed anesthesia with the patient or authorized representative who has indicated his/her understanding and acceptance.   Dental advisory given  Plan Discussed with: CRNA  Anesthesia Plan Comments:        Anesthesia Quick Evaluation

## 2012-11-18 NOTE — Transfer of Care (Signed)
Immediate Anesthesia Transfer of Care Note  Patient: Misty Moran  Procedure(s) Performed: Procedure(s): OPEN SIGMOID COLECTOMY, splenic flexure mobilization, rigid protoscope (N/A)  Patient Location: PACU  Anesthesia Type:General  Level of Consciousness: awake, patient cooperative and responds to stimulation  Airway & Oxygen Therapy: spontaneous resp, O2 via face mask  Post-op Assessment: Report given to PACU RN, Post -op Vital signs reviewed and stable and Patient moving all extremities X 4  Post vital signs: Reviewed and stable  Complications: No apparent anesthesia complications

## 2012-11-18 NOTE — Interval H&P Note (Signed)
History and Physical Interval Note:  11/18/2012 12:52 PM  Misty Moran  has presented today for surgery, with the diagnosis of Diverticulitis  The goals and the various methods of treatment have been discussed with the patient and family. After consideration of risks, benefits and other options for treatment, the patient has consented to  Procedure(s): OPEN SIGMOID COLECTOMY (N/A) as a surgical intervention .  The patient's history has been reviewed, patient examined today, no change in status, stable for surgery.  I have reviewed the patient's chart and labs.  Questions were answered to the patient's satisfaction.     Ernestene Mention

## 2012-11-18 NOTE — Preoperative (Signed)
Beta Blockers   Reason not to administer Beta Blockers:Not Applicable 

## 2012-11-18 NOTE — Op Note (Signed)
Patient Name:           Misty Moran   Date of Surgery:        11/18/2012  Pre op Diagnosis:      Diverticulitis   Post op Diagnosis:    same  Procedure:                 Takedown of splenic flexure of colon, sigmoid colectomy, coloproctostomy with 29 mm EEA stapler  Surgeon:                     Angelia Mould. Derrell Lolling, M.D., FACS  Assistant:                      Karie Soda, M.D., FACS  Operative Indications:   Misty Moran is a 47 y.o. Female.  She has a past history of diverticulitis and has been treated as an outpatient several times in the past. She was recently evaluated because of a tender palpable mass in the left lower quadrant. CT scan showed sigmoid diverticulosis, some wall thickening and borderline adenopathy which was unchanged since 2011. Muscular hypertrophy of the colon was noted. Dr. Yancey Flemings performed a colonoscopy on 09/14/2012 which shows moderate diverticulosis in the ascending colon and descending colon but severe diverticulosis with stenosis in the sigmoid colon. The colon was otherwise normal. Barium enema was performed on 10/07/2012 and this shows extensive irregularity, diverticulosis and stenosis of the sigmoid colon and distal descending colon along a segment approximately 17 cm. Mild diverticulosis of the descending colon was noted. Other colonic segments were normal. Because of the recurrent attacks, continued intermittent pain and cramps, and the palpable mass, she has elected to go ahead with surgery at this time. She tolerated a bowel prep at home yesterday but did have some cramps and nausea.  Operative Findings:       There was a significant palpable mass in the sigmoid colon just above the true pelvis. This was adherent to the uterus and left adnexa as well as the retroperitoneum. The rectum was soft. The descending colon was soft. We had to completely mobilize the splenic flexure to get the anastomosis performed without tension. The uterus was enlarged  chronically. There was no evidence of cancer. Her moderate adhesions from her previous abdominal  surgery. Her abdominal wall fascia was extremely tough and leathery and making it difficult to dissect going in and difficult to pass needles during closure.. Anastomosis was created with a 29 mm EEA stapler and appeared to be at about the 15 cm mark. Air leak test was negative  Procedure in Detail:          Following the induction of general endotracheal anesthesia the patient had a Foley catheter placed and she was placed in a lithotomy position and rigid padded stirrups. The abdomen and perineum were prepped and draped in a sterile fashion. Surgical time out was performed. Intravenous antibiotics were given. A midline incision was made from the pubic hair all the way up and around to the right of the umbilicus and then somewhat above to take  the splenic flexure down. The fascia was incised in the midline and we slowly entered the abdominal cavity. There were chronic adhesions of omentum to the abdominal wall on the right side. Exploration  of the abdomen and pelvis revealed findings as described above. Self-retaining retractor was placed. We slowly dissected the colon from lateral to medial dividing the lateral attachments in small  steps with scissors or  sometimes with electrocautery. We identified the left ureter and preserved it. We took the dissection all the way up along the left paracolic gutter mobilizing the entire descending colon. It became apparent that we would need to take down the splenic flexure. I lifted the omentum up and separated the omentum from the left transverse colon and fully mobilized the splenic flexure pulling it down without incident. The distal descending colon proximal to the inflammatory mass was isolated and clamped with Allen clamps and divided. The mesentery of the sigmoid and proximal rectum were divided with the LigaSure device, being sure to stay close to the colon wall. We  took the dissection down to the proximal rectum just below the level of the sacral promontory. The rectum was soft at that point and was cleaned off and then divided with a Contour stapler, green load.   A ruptured diverticulum was encountered in the sigmoid colon. A little bit of stool was spilled, irrigated and cleaned out and the ruptured diverticulum was oversewn with silk sutures. Specimen was marked with a long silk suture to mark the distal margin and  the specimen was sent to the lab. We irrigated somewhat. Now that we had mobilized the proximal and distal segments quite well and we had no tension for the intended anastomosis. The proximal segment was opened up and looked pink and healthy. Placed a pursestring suture of 0 Prolene in the proximal bowel. Sizers confirmed that a 29 mm stapler would work. We put the anvil of a 29 mm stapler in the proximal colon and tied the pursestring suture down.  Dr. Michaell Cowing performed proctoscopy and then inserted the 29 mm EEA stapler all the way up to the end of the stapled rectum. He opened the stapler and the spike came out nicely. After checking to make sure I had not twisted the proximal segment I secured it onto the spike of the stapler. The stapler was then closed, held in place for a minute, fired opened and removed. We had 2 excellent doughnuts. Dr. Michaell Cowing performed  proctoscopy and said the anastomosis was at about 15 cm. It was insufflated and there was no airleak.  We changed all of our instruments and gowns and gloves. We irrigated thoroughly. We checked all the areas of dissection of the splenic flexure and down in the pelvis. Some of the tissues were raw.  The ureter was uninjured. A 19 [place] Blake drain was placed into the pelvis and brought out through a separate stab incision in the right lower quadrant sutured to the  the skin with nylon suture and connected to a suction bulb. The omentum was returned to its anatomic position. Midline fascia was closed  with running suture of #1, double-stranded PDS. It was irrigated and skin closed loosely with staples and Telfa wicks in between and a honeycomb bandage applied.  The patient tolerated the procedure well and was taken recovery room in stable condition. EBL 200 cc. Counts correct. Complications none.     Angelia Mould. Derrell Lolling, M.D., FACS General and Minimally Invasive Surgery Breast and Colorectal Surgery  11/18/2012 3:54 PM

## 2012-11-18 NOTE — Anesthesia Postprocedure Evaluation (Signed)
  Anesthesia Post-op Note  Patient: Misty Moran  Procedure(s) Performed: Procedure(s) (LRB): OPEN SIGMOID COLECTOMY, splenic flexure mobilization, rigid protoscope (N/A)  Patient Location: PACU  Anesthesia Type: General  Level of Consciousness: awake and alert   Airway and Oxygen Therapy: Patient Spontanous Breathing  Post-op Pain: mild  Post-op Assessment: Post-op Vital signs reviewed, Patient's Cardiovascular Status Stable, Respiratory Function Stable, Patent Airway and No signs of Nausea or vomiting  Last Vitals:  Filed Vitals:   11/18/12 1700  BP:   Pulse:   Temp: 36.6 C  Resp:     Post-op Vital Signs: stable   Complications: No apparent anesthesia complications

## 2012-11-19 LAB — CBC
Hemoglobin: 8 g/dL — ABNORMAL LOW (ref 12.0–15.0)
MCH: 21.6 pg — ABNORMAL LOW (ref 26.0–34.0)
MCHC: 30.5 g/dL (ref 30.0–36.0)

## 2012-11-19 LAB — BASIC METABOLIC PANEL
BUN: 6 mg/dL (ref 6–23)
Creatinine, Ser: 0.56 mg/dL (ref 0.50–1.10)
GFR calc non Af Amer: >90 mL/min (ref >90)
Glucose, Bld: 154 mg/dL — ABNORMAL HIGH (ref 70–99)
Potassium: 4.2 mEq/L (ref 3.5–5.1)

## 2012-11-19 MED ORDER — ACETAMINOPHEN 10 MG/ML IV SOLN
1000.0000 mg | Freq: Four times a day (QID) | INTRAVENOUS | Status: AC | PRN
  Administered 2012-11-19: 1000 mg via INTRAVENOUS
  Filled 2012-11-19 (×2): qty 100

## 2012-11-19 NOTE — Progress Notes (Signed)
Patient ID: Misty Moran, female   DOB: 06-13-1965, 47 y.o.   MRN: [id number] 1 Day Post-Op  Subjective: No major complaints. Good pain control with the PCA. Mild occasional nausea without vomiting.  Objective: Vital signs in last 24 hours: Temp:  [97.4 F (36.3 C)-98.3 F (36.8 C)] 98.1 F (36.7 C) (08/23 0500) Pulse Rate:  [81-94] 93 (08/23 0500) Resp:  [12-20] 15 (08/23 0500) BP: (124-152)/(75-89) 124/75 mmHg (08/23 0500) SpO2:  [97 %-100 %] 99 % (08/23 0500) Weight:  [214 lb (97.07 kg)] 214 lb (97.07 kg) (08/22 1803) Last BM Date: 11/17/12  Intake/Output from previous day: 08/22 0701 - 08/23 0700 In: 3068.8 [I.V.:3068.8] Out: 1640 [Urine:1300; Drains:190; Blood:150] Intake/Output this shift:    General appearance: alert, cooperative and no distress Resp: clear to auscultation bilaterally GI: moderate peri-incisional and lower abdominal tenderness. Incision/Wound: some bloody drainage under the bandage. JP drainage is serous sanguinous.  Lab Results:   Recent Labs  11/18/12 1747 11/19/12 0452  WBC 19.1* 24.9*  HGB 7.8* 8.0*  HCT 25.0* 26.2*  PLT 370 359   BMET  Recent Labs  11/18/12 1747 11/19/12 0452  NA 136 133*  K 3.5 4.2  CL 103 100  CO2 25 25  GLUCOSE 134* 154*  BUN 6 6  CREATININE 0.63 0.56  CALCIUM 8.2* 8.5     Studies/Results: No results found.  Anti-infectives: Anti-infectives   Start     Dose/Rate Route Frequency Ordered Stop   11/19/12 0200  cefoTEtan (CEFOTAN) 2 g in dextrose 5 % 50 mL IVPB     2 g 100 mL/hr over 30 Minutes Intravenous Every 12 hours 11/18/12 1738 11/19/12 0154   11/18/12 0728  cefoTEtan (CEFOTAN) 2 g in dextrose 5 % 50 mL IVPB     2 g 100 mL/hr over 30 Minutes Intravenous On call to O.R. 11/18/12 0728 11/18/12 1330      Assessment/Plan: s/p Procedure(s): OPEN SIGMOID COLECTOMY, splenic flexure mobilization, rigid protoscope Stable postoperatively. Had anemia preoperatively. Slight drop. Observe and repeat  CBC tomorrow. Out of bed today. Work on pulmonary toilet. Will continue ice chips only as she has some nausea.   LOS: 1 day    Misty Moran T 11/19/2012

## 2012-11-20 LAB — CBC
MCV: 70.9 fL — ABNORMAL LOW (ref 78.0–100.0)
Platelets: 319 10*3/uL (ref 150–400)
RBC: 2.99 MIL/uL — ABNORMAL LOW (ref 3.87–5.11)
RDW: 17.2 % — ABNORMAL HIGH (ref 11.5–15.5)
WBC: 22.2 10*3/uL — ABNORMAL HIGH (ref 4.0–10.5)

## 2012-11-20 LAB — PREPARE RBC (CROSSMATCH)

## 2012-11-20 MED ORDER — OXYCODONE-ACETAMINOPHEN 5-325 MG PO TABS
1.0000 | ORAL_TABLET | ORAL | Status: DC | PRN
  Administered 2012-11-21 – 2012-11-22 (×5): 1 via ORAL
  Administered 2012-11-22 – 2012-11-23 (×2): 2 via ORAL
  Administered 2012-11-23: 1 via ORAL
  Administered 2012-11-23 – 2012-11-25 (×6): 2 via ORAL
  Filled 2012-11-20: qty 2
  Filled 2012-11-20 (×2): qty 1
  Filled 2012-11-20 (×2): qty 2
  Filled 2012-11-20 (×2): qty 1
  Filled 2012-11-20 (×4): qty 2
  Filled 2012-11-20 (×3): qty 1
  Filled 2012-11-20: qty 2

## 2012-11-20 MED ORDER — TRAMADOL HCL 50 MG PO TABS
50.0000 mg | ORAL_TABLET | Freq: Four times a day (QID) | ORAL | Status: DC | PRN
  Administered 2012-11-20 – 2012-11-22 (×3): 50 mg via ORAL
  Filled 2012-11-20 (×3): qty 1

## 2012-11-20 NOTE — Progress Notes (Signed)
CRITICAL VALUE ALERT  Critical value received:  Hgb 6.6  Date of notification:  11/20/12  Time of notification:  0455  Critical value read back:yes  Nurse who received alert:  A. Val Eagle, RN  MD notified (1st page):  Dr Luisa Hart  Time of first page:  0510  MD notified (2nd page):  Time of second page:  Responding MD:  Dr Luisa Hart  Time MD responded:  [phone]

## 2012-11-20 NOTE — Progress Notes (Signed)
Patient ID: Misty Moran, female   DOB: 1965/04/01, 47 y.o.   MRN: [id number] 2 Days Post-Op  Subjective: No major complaints. Incisional pain when moving but no worse. No nausea and wants to eat. No flatus yet.  Objective: Vital signs in last 24 hours: Temp:  [97.6 F (36.4 C)-98.2 F (36.8 C)] 98 F (36.7 C) (08/24 0705) Pulse Rate:  [75-95] 93 (08/24 0705) Resp:  [12-20] 18 (08/24 0705) BP: (119-146)/(74-84) 146/84 mmHg (08/24 0705) SpO2:  [92 %-100 %] 100 % (08/24 0642) Last BM Date: 11/17/12  Intake/Output from previous day: 08/23 0701 - 08/24 0700 In: 4350 [I.V.:4250; IV Piggyback:100] Out: 3025 [Urine:2950; Drains:75] Intake/Output this shift:    General appearance: alert, cooperative and no distress GI: minimal tenderness around the incision. Bowel sounds present. JP drainage serosanguineous. Incision/Wound: old blood on the dressing. No erythema or drainage.  Lab Results:   Recent Labs  11/19/12 0452 11/20/12 0435  WBC 24.9* 22.2*  HGB 8.0* 6.6*  HCT 26.2* 21.2*  PLT 359 319   BMET  Recent Labs  11/18/12 1747 11/19/12 0452  NA 136 133*  K 3.5 4.2  CL 103 100  CO2 25 25  GLUCOSE 134* 154*  BUN 6 6  CREATININE 0.63 0.56  CALCIUM 8.2* 8.5     Studies/Results: No results found.  Anti-infectives: Anti-infectives   Start     Dose/Rate Route Frequency Ordered Stop   11/19/12 0200  cefoTEtan (CEFOTAN) 2 g in dextrose 5 % 50 mL IVPB     2 g 100 mL/hr over 30 Minutes Intravenous Every 12 hours 11/18/12 1738 11/19/12 0154   11/18/12 0728  cefoTEtan (CEFOTAN) 2 g in dextrose 5 % 50 mL IVPB     2 g 100 mL/hr over 30 Minutes Intravenous On call to O.R. 11/18/12 0728 11/18/12 1330      Assessment/Plan: s/p Procedure(s): OPEN SIGMOID COLECTOMY, splenic flexure mobilization, rigid protoscope Blood loss anemia. Hemoglobin down this morning and transfusion is in progress. Persistent elevated white count. Abdominal exam seems appropriate and no  evidence of infection. Repeat CBC tomorrow. Clear liquid diet. Mobilization encouraged and she has been out of bed.    LOS: 2 days    Daire Okimoto T 11/20/2012

## 2012-11-21 ENCOUNTER — Encounter (HOSPITAL_COMMUNITY): Payer: Self-pay | Admitting: General Surgery

## 2012-11-21 LAB — TYPE AND SCREEN
ABO/RH(D): O POS
Antibody Screen: NEGATIVE
Unit division: 0

## 2012-11-21 LAB — CBC
Hemoglobin: 8.9 g/dL — ABNORMAL LOW (ref 12.0–15.0)
MCH: 24.2 pg — ABNORMAL LOW (ref 26.0–34.0)
Platelets: 327 10*3/uL (ref 150–400)
RBC: 3.68 MIL/uL — ABNORMAL LOW (ref 3.87–5.11)
WBC: 19.5 10*3/uL — ABNORMAL HIGH (ref 4.0–10.5)

## 2012-11-21 LAB — BASIC METABOLIC PANEL
CO2: 27 mEq/L (ref 19–32)
Calcium: 8.6 mg/dL (ref 8.4–10.5)
Chloride: 99 mEq/L (ref 96–112)
Glucose, Bld: 86 mg/dL (ref 70–99)
Potassium: 3.7 mEq/L (ref 3.5–5.1)
Sodium: 135 mEq/L (ref 135–145)

## 2012-11-21 MED ORDER — MORPHINE SULFATE 2 MG/ML IJ SOLN: 1.0000 mg | INTRAMUSCULAR | Status: DC | PRN

## 2012-11-21 NOTE — Progress Notes (Signed)
Patient complained of bleeding from her bladder. Denies it being her period or from her rectum. 1/3 Pad stained more blood with clots in urine hat. Notified Dr. Maisie Fus on call for [facility].

## 2012-11-21 NOTE — Progress Notes (Signed)
No change in assessment. Will continue current plan of care. Misty Moran 

## 2012-11-21 NOTE — Care Management Note (Signed)
    Page 1 of 1   11/21/2012     12:02:57 PM   CARE MANAGEMENT NOTE 11/21/2012  Patient:  Misty Moran, Misty Moran   Account Number:  [id number]  Date Initiated:  11/21/2012  Documentation initiated by:  Lorenda Ishihara  Subjective/Objective Assessment:   47 yo female admitted s/p open colectomy. PTA lived at home with family.     Action/Plan:   Home when stable   Anticipated DC Date:  11/24/2012   Anticipated DC Plan:  HOME/SELF CARE      DC Planning Services  CM consult      Choice offered to / List presented to:             Status of service:  Completed, signed off Medicare Important Message given?   (If response is "NO", the following Medicare IM given date fields will be blank) Date Medicare IM given:   Date Additional Medicare IM given:    Discharge Disposition:  HOME/SELF CARE  Per UR Regulation:  Reviewed for med. necessity/level of care/duration of stay  If discussed at Long Length of Stay Meetings, dates discussed:    Comments:

## 2012-11-21 NOTE — Progress Notes (Signed)
3 Days Post-Op  Subjective: Basically stable. Tolerating clear liquids. No nausea. No flatus or stool yet. Dilaudid PCA gave her a headache and she is using tramadol now.. Voiding without difficulty.  Transfused yesterday.hemoglobin rise from 6.6-8.9, appropriate.  Objective: Vital signs in last 24 hours: Temp:  [98 F (36.7 C)-99.9 F (37.7 C)] 99.2 F (37.3 C) (08/25 0530) Pulse Rate:  [81-93] 86 (08/25 0530) Resp:  [15-21] 18 (08/25 0530) BP: (122-147)/(78-88) 137/81 mmHg (08/25 0530) SpO2:  [94 %-100 %] 95 % (08/25 0530) Last BM Date: 11/17/12  Intake/Output from previous day: 08/24 0701 - 08/25 0700 In: 2415 [P.O.:240; I.V.:2162.5; Blood:12.5] Out: 3555 [Urine:3500; Drains:55] Intake/Output this shift: Total I/O In: 1200 [I.V.:1200] Out: 2075 [Urine:2050; Drains:25]  General appearance: alert. Cooperative. No significant distress. Mental status normal. Resp: clear to auscultation bilaterally GI: soft. Appropriately tender. Not distended. Hypoactive bowel sounds. Wound looks clean.JP drainage serosanguineous, odorless.  Lab Results:  Results for orders placed during the hospital encounter of 11/18/12 (from the past 24 hour(s))  CBC     Status: Abnormal   Collection Time    11/21/12  4:12 AM      Result Value Range   WBC 19.5 (*) 4.0 - 10.5 K/uL   RBC 3.68 (*) 3.87 - 5.11 MIL/uL   Hemoglobin 8.9 (*) 12.0 - 15.0 g/dL   HCT 16.1 (*) 09.6 - 04.5 %   MCV 72.3 (*) 78.0 - 100.0 fL   MCH 24.2 (*) 26.0 - 34.0 pg   MCHC 33.5  30.0 - 36.0 g/dL   RDW 40.9 (*) 81.1 - 91.4 %   Platelets 327  150 - 400 K/uL  BASIC METABOLIC PANEL     Status: Abnormal   Collection Time    11/21/12  4:12 AM      Result Value Range   Sodium 135  135 - 145 mEq/L   Potassium 3.7  3.5 - 5.1 mEq/L   Chloride 99  96 - 112 mEq/L   CO2 27  19 - 32 mEq/L   Glucose, Bld 86  70 - 99 mg/dL   BUN 5 (*) 6 - 23 mg/dL   Creatinine, Ser 7.82  0.50 - 1.10 mg/dL   Calcium 8.6  8.4 - 95.6 mg/dL   GFR calc  non Af Amer >90  >90 mL/min   GFR calc Af Amer >90  >90 mL/min     Studies/Results: @RISRSLT24 @  . alvimopan  12 mg Oral BID  . enoxaparin (LOVENOX) injection  40 mg Subcutaneous Q24H     Assessment/Plan: s/p Procedure(s): OPEN SIGMOID COLECTOMY, splenic flexure mobilization, rigid protoscope  POD #3. Stable. Continue liquid diet until she begins passing flatus and stool Entereg Increase ambulation DC PCA. Will use Tramadol or Percocet or intermittent IV morphine as needed for pain. Check pathology.  Acute blood loss anemia superimposed on chronic anemia. Appropriate response to transfusion. No evidence of ongoing bleeding.  Persistent leukocytosis, slowly resolving. Significance unclear. No clinical evidence of pulmonary or intra-abdominal infection.  @PROBHOSP @  LOS: 3 days    Tekoa Hamor M. Derrell Lolling, M.D., [facility] General and Minimally invasive Surgery Breast and Colorectal Surgery Office:   [phone] Pager:   [phone]  11/21/2012  . .prob

## 2012-11-22 LAB — URINALYSIS, ROUTINE W REFLEX MICROSCOPIC
Glucose, UA: NEGATIVE mg/dL
Leukocytes, UA: NEGATIVE
Protein, ur: NEGATIVE mg/dL
Specific Gravity, Urine: 1.015 (ref 1.005–1.030)

## 2012-11-22 LAB — URINE MICROSCOPIC-ADD ON

## 2012-11-22 NOTE — Progress Notes (Signed)
4 Days Post-Op  Subjective: Stable and alert. Notices lots of gas roughly an abdomen and has occasional cramps but no flatus or stool. Tolerating clear liquids. No nausea. Ambulating in the hall.  Since she has blood and blood clots in her urine. Since her last menstrual period was less than 2 weeks ago. No dysuria. Excellent urine output.  Objective: Vital signs in last 24 hours: Temp:  [98.5 F (36.9 C)-99.2 F (37.3 C)] 98.5 F (36.9 C) (08/25 2155) Pulse Rate:  [80-92] 80 (08/25 2155) Resp:  [18] 18 (08/25 2155) BP: (137-147)/(70-83) 147/83 mmHg (08/25 2155) SpO2:  [96 %] 96 % (08/25 2155) Last BM Date: 11/17/12  Intake/Output from previous day: 08/25 0701 - 08/26 0700 In: 540 [P.O.:240; I.V.:300] Out: 3835 [Urine:3750; Drains:85] Intake/Output this shift: Total I/O In: 240 [P.O.:240] Out: 1040 [Urine:1000; Drains:40]  General appearance: alert. Mental status normal. Cooperative. Looks comfortable and in no distress. Resp: clear to auscultation bilaterally GI: abdomen soft and nondistended. Hypoactive bowel sounds. Wound dressing in place. Dry. No bleeding. Benign abdominal exam considering length of incision.  Lab Results:  No results found for this or any previous visit (from the past 24 hour(s)).   Studies/Results: @RISRSLT24 @  . alvimopan  12 mg Oral BID  . enoxaparin (LOVENOX) injection  40 mg Subcutaneous Q24H     Assessment/Plan: s/p Procedure(s): OPEN SIGMOID COLECTOMY, splenic flexure mobilization, rigid protoscope  POD #4. Stable. Looks good. Continue liquid diet until she begins passing flatus and stool  Entereg  Increase ambulation  Check pathology.   Hematuria versus abnormal menstrual flow. We'll check urinalysis and urine culture now. If she clearly has bloody urine, will ask her urologist(Dr. Laverle Patter) to see her in evaluation. History a right partial nephrectomy for benign disease 2011.  Acute blood loss anemia superimposed on chronic anemia.  Appropriate response to transfusion. No evidence of ongoing bleeding.check cbc tomorrow  Persistent leukocytosis, slowly resolving. Significance unclear. No clinical evidence of pulmonary or intra-abdominal infection.   @PROBHOSP @  LOS: 4 days    Armond Cuthrell M. Derrell Lolling, M.D., [facility] General and Minimally invasive Surgery Breast and Colorectal Surgery Office:   [phone] Pager:   [phone]  11/22/2012  . .prob

## 2012-11-23 LAB — BASIC METABOLIC PANEL
BUN: 3 mg/dL — ABNORMAL LOW (ref 6–23)
CO2: 25 mEq/L (ref 19–32)
Glucose, Bld: 94 mg/dL (ref 70–99)
Potassium: 3.7 mEq/L (ref 3.5–5.1)
Sodium: 135 mEq/L (ref 135–145)

## 2012-11-23 LAB — URINE CULTURE: Colony Count: NO GROWTH

## 2012-11-23 LAB — CBC
HCT: 25.3 % — ABNORMAL LOW (ref 36.0–46.0)
Hemoglobin: 8.1 g/dL — ABNORMAL LOW (ref 12.0–15.0)
MCH: 23.1 pg — ABNORMAL LOW (ref 26.0–34.0)
RBC: 3.5 MIL/uL — ABNORMAL LOW (ref 3.87–5.11)

## 2012-11-23 MED ORDER — BISACODYL 10 MG RE SUPP
10.0000 mg | Freq: Once | RECTAL | Status: AC
  Administered 2012-11-23: 10 mg via RECTAL
  Filled 2012-11-23: qty 1

## 2012-11-23 NOTE — Progress Notes (Signed)
Patient continues to have heavy bleeding with clots from her vagina. She states she feels weak as if she was on her period. No s/s of distress will continue to monitor.

## 2012-11-23 NOTE — Progress Notes (Signed)
5 Days Post-Op  Subjective: Remained stable and alert. Tolerating lots of clear liquids. Having some cramps but no nausea. No flatus and no stool he. She feels that is almost ready to have. Ambulating in the hall.  Pathology report shows diverticulitis. No malignancy seen.Patient advised.  Vaginal bleeding has stopped.  Urinalysis shows only 7-10 RBC per high-powered field. Culture pending.  Objective: Vital signs in last 24 hours: Temp:  [97.5 F (36.4 C)-98.8 F (37.1 C)] 97.5 F (36.4 C) (08/27 0601) Pulse Rate:  [83-90] 83 (08/27 0601) Resp:  [16-18] 16 (08/27 0601) BP: (128-137)/(75-88) 128/75 mmHg (08/27 0601) SpO2:  [96 %-100 %] 96 % (08/27 0601) Last BM Date: 11/17/12  Intake/Output from previous day: 08/26 0701 - 08/27 0700 In: 1800 [I.V.:1800] Out: 2462 [Urine:2450; Drains:12] Intake/Output this shift:    General appearance: alert. Cooperative. Mental status normal. No distress. Lathing at times. Resp: clear to auscultation bilaterally GI: abdomen is soft. Nondistended. Minimally tender. Hypoactive bowel sounds. Wound looks okay.  Lab Results:  Results for orders placed during the hospital encounter of 11/18/12 (from the past 24 hour(s))  CBC     Status: Abnormal   Collection Time    11/23/12  4:29 AM      Result Value Range   WBC 10.7 (*) 4.0 - 10.5 K/uL   RBC 3.50 (*) 3.87 - 5.11 MIL/uL   Hemoglobin 8.1 (*) 12.0 - 15.0 g/dL   HCT 78.2 (*) 95.6 - 21.3 %   MCV 72.3 (*) 78.0 - 100.0 fL   MCH 23.1 (*) 26.0 - 34.0 pg   MCHC 32.0  30.0 - 36.0 g/dL   RDW 08.6 (*) 57.8 - 46.9 %   Platelets 330  150 - 400 K/uL  BASIC METABOLIC PANEL     Status: Abnormal   Collection Time    11/23/12  4:29 AM      Result Value Range   Sodium 135  135 - 145 mEq/L   Potassium 3.7  3.5 - 5.1 mEq/L   Chloride 102  96 - 112 mEq/L   CO2 25  19 - 32 mEq/L   Glucose, Bld 94  70 - 99 mg/dL   BUN 3 (*) 6 - 23 mg/dL   Creatinine, Ser 6.29  0.50 - 1.10 mg/dL   Calcium 8.4  8.4 - 52.8  mg/dL   GFR calc non Af Amer >90  >90 mL/min   GFR calc Af Amer >90  >90 mL/min     Studies/Results: @RISRSLT24 @  . alvimopan  12 mg Oral BID  . bisacodyl  10 mg Rectal Once  . enoxaparin (LOVENOX) injection  40 mg Subcutaneous Q24H     Assessment/Plan: s/p Procedure(s): OPEN SIGMOID COLECTOMY, splenic flexure mobilization, rigid protoscope  POD #5. Stable. Looks good.  Continue liquid diet until she begins passing flatus and stool  Try prune juice and dulcolax supp. Entereg  Increase ambulation   Hematuria versus abnormal menstrual flow. Suspect she had transient abnormal menstrual flow secondary to anesthesia and pelvic surgery. We'll check urine culture.  Acute blood loss anemia superimposed on chronic anemia. Appropriate response to transfusion. No evidence of ongoing bleeding.check cbc tomorrow   Persistent leukocytosis, Resolved. Significance unclear. No clinical evidence of pulmonary or intra-abdominal infection   @PROBHOSP @  LOS: 5 days    Ludwin Flahive M. Derrell Lolling, M.D., [facility] General and Minimally invasive Surgery Breast and Colorectal Surgery Office:   [phone] Pager:   [phone]  11/23/2012  . .prob

## 2012-11-24 LAB — CBC
MCH: 22.7 pg — ABNORMAL LOW (ref 26.0–34.0)
MCHC: 31.3 g/dL (ref 30.0–36.0)
MCV: 72.4 fL — ABNORMAL LOW (ref 78.0–100.0)
Platelets: 302 10*3/uL (ref 150–400)
RBC: 3.48 MIL/uL — ABNORMAL LOW (ref 3.87–5.11)

## 2012-11-24 MED ORDER — FERROUS SULFATE 325 (65 FE) MG PO TABS
325.0000 mg | ORAL_TABLET | Freq: Every day | ORAL | Status: DC
  Administered 2012-11-24 – 2012-11-25 (×2): 325 mg via ORAL
  Filled 2012-11-24 (×3): qty 1

## 2012-11-24 MED ORDER — MEDROXYPROGESTERONE ACETATE 10 MG PO TABS
10.0000 mg | ORAL_TABLET | Freq: Every day | ORAL | Status: DC
  Administered 2012-11-24: 10 mg via ORAL
  Filled 2012-11-24 (×2): qty 1

## 2012-11-24 NOTE — Progress Notes (Addendum)
6 Days Post-Op  Subjective: Has had recurrent vaginal bleeding with clots. 2 or 3 pads a day. She knows she has fibroids and is followed by Dr. Gaynell Face. At surgery her uterus was enlarged and smooth, the size of a small grapefruit.I had to dissect the inflamed sigmoid colon off of the left horn of the uterus and left adnexa.  Otherwise doing well. Has had 2 loose bowel movements. Tolerating liquid diet. Ambulating in halls. Pain control excellent.  Objective: Vital signs in last 24 hours: Temp:  [98.1 F (36.7 C)-99.3 F (37.4 C)] 98.4 F (36.9 C) (08/28 0420) Pulse Rate:  [88-95] 88 (08/28 0420) Resp:  [18] 18 (08/28 0420) BP: (121-136)/(81-90) 135/81 mmHg (08/28 0420) SpO2:  [98 %] 98 % (08/28 0420) Last BM Date: 11/23/12  Intake/Output from previous day: 08/27 0701 - 08/28 0700 In: 2853.8 [P.O.:1080; I.V.:1773.8] Out: 2527 [Urine:2500; Drains:27] Intake/Output this shift: Total I/O In: 1255 [P.O.:360; I.V.:895] Out: 1017 [Urine:1000; Drains:17]    EXAM: General appearance: looks good. Alert. Friendly. No distress. Mental status normal. GI: soft. Nondistended. Minimally tender. Midline wound looks good, just a little serous drainage. No sign of infection.    Lab Results:  No results found for this or any previous visit (from the past 24 hour(s)).   Studies/Results: @RISRSLT24 @  . enoxaparin (LOVENOX) injection  40 mg Subcutaneous Q24H     Assessment/Plan: s/p Procedure(s): OPEN SIGMOID COLECTOMY, splenic flexure mobilization, rigid protoscope    POD #6. Stable. Looks good.  Advance diet to low residue  Discontinued Entereg  Probable discharge tomorrow.  Urine Culture-no growth  abnormal menstrual flow. Suspect she has abnormal menstrual flow secondary to anesthesia and pelvic surgery. Since this has recurred, will check CBC and contact her gynecologist. Dr. Gaynell Face advises Provera 10 mg a day for 10 days and to follow up with him in his office in 3-4  weeks.  Acute blood loss anemia superimposed on chronic anemia. Appropriate response to transfusion. No evidence of ongoing bleeding.check cbc      @PROBHOSP @  LOS: 6 days    Fawaz Borquez M. Derrell Lolling, M.D., [facility] General and Minimally invasive Surgery Breast and Colorectal Surgery Office:   [phone] Pager:   [phone]  11/24/2012  . .prob

## 2012-11-25 MED ORDER — FERROUS SULFATE 325 (65 FE) MG PO TABS: 325.0000 mg | ORAL_TABLET | Freq: Every day | ORAL | Status: DC

## 2012-11-25 MED ORDER — OXYCODONE-ACETAMINOPHEN 5-325 MG PO TABS: 1.0000 | ORAL_TABLET | ORAL | Status: DC | PRN

## 2012-11-25 MED ORDER — MEDROXYPROGESTERONE ACETATE 10 MG PO TABS: 10.0000 mg | ORAL_TABLET | Freq: Every day | ORAL | Status: DC

## 2012-11-25 NOTE — Progress Notes (Signed)
Assessment unchanged. Pt verbalized understanding of dc instructions through teach back and able to tell nurse when to follow up with MD post dc. Familiar with My Chart with plans to sign up soon. Scripts x1 given as provided by MD. Dressing supplies provided for home. JP drain dc'd as ordered prior to dc home and covered with gauze. Discharged via wc to front entrance to meet awaiting vehicle to carry home. Accompanied by NT and mom.

## 2012-11-25 NOTE — Discharge Summary (Signed)
Patient ID: Misty Moran [id number] 47 y.o. 12-Jan-1966  Admit date: 11/18/2012  Discharge date and time: 11/25/2012  Admitting Physician: Ernestene Mention  Discharge Physician: Ernestene Mention  Admission Diagnoses: diverticulitis  Discharge Diagnoses: diverticulitis Anemia, acute blood loss superimposed on chronic anemia Abnormal menstrual bleeding  Operations: Procedure(s): OPEN SIGMOID COLECTOMY, splenic flexure mobilization, rigid protoscope  Admission Condition: good  Discharged Condition: good  Indication for Admission: Misty Moran is a 47 y.o. Female. She has a past history of diverticulitis and has been treated as an outpatient several times in the past. She was recently evaluated because of a tender palpable mass in the left lower quadrant. CT scan showed sigmoid diverticulosis, some wall thickening and borderline adenopathy which was unchanged since 2011. Muscular hypertrophy of the colon was noted. Dr. Yancey Flemings performed a colonoscopy on 09/14/2012 which shows moderate diverticulosis in the ascending colon and descending colon but severe diverticulosis with stenosis in the sigmoid colon. The colon was otherwise normal. Barium enema was performed on 10/07/2012 and this shows extensive irregularity, diverticulosis and stenosis of the sigmoid colon and distal descending colon along a segment approximately 17 cm. Mild diverticulosis of the descending colon was noted. Other colonic segments were normal. Because of the recurrent attacks, continued intermittent pain and cramps, and the palpable mass, she has elected to go ahead with surgery at this time. She tolerated a bowel prep at home yesterday but did have some cramps and nausea.on admission she was anemic with a hemoglobin of 8.2.   Hospital Course: on the day of admission the patient was taken to the operating room and underwent exploratory laparotomy, takedown of splenic flexure, sigmoid colectomy and coloproctostomy  with 29 mm EEA stapler. Final pathology showed diverticulitis, no malignancy.the patient was stable postop. She had an ileus for a few days but ultimately this resolved and prior to discharge her diet was advanced to regular food and she had had several bowel movements without signs of any intra-abdominal problems. Her hemoglobin drifted down to 6.6 and she was transfused 2 units of PRBC.she had an appropriate response to transfusion,and hemoglobin was 7.9 at discharge and she was asymptomatic. She developed some abnormal uterine bleeding with bleeding and clots, mid cycle. She is known to have an enlarged uterus with fibroids. This was discussed with her gynecologist, Dr. Gaynell Face, and we gave her a 10 day supply of Provera and followup with Dr. Gaynell Face as an outpatient for management.  At the time of discharge the patient was ambulatory independently, voiding uneventfully, had minimal pain felt well and was ready to go home. JP drainage was serosanguineous and the drain was removed. Wound looked good a couple of areas of serous drainage in the left bandage and staples in place. She was instructed in diet and activities. She was advised to followup with me in the office in approximately 5 days for a wound check and staple removal.  She was given a prescription for oxycodone for pain, and prescription for Provera 10 mg daily for 10 days, and ferrous sulfate tablets.  Consults: None  Significant Diagnostic Studies: surgical pathology  Treatments: surgery: take down splenic flexure, sigmoid colectomy, coloproctostomy.  Disposition: Home  Patient Instructions:    Medication List         ferrous sulfate 325 (65 FE) MG tablet  Take 1 tablet (325 mg total) by mouth daily with breakfast.     medroxyPROGESTERone 10 MG tablet  Commonly known as:  PROVERA  Take 1 tablet (10 mg total)  by mouth daily.     multivitamin with minerals Tabs tablet  Take 1 tablet by mouth daily.      oxyCODONE-acetaminophen 5-325 MG per tablet  Commonly known as:  ROXICET  Take 1 tablet by mouth every 4 (four) hours as needed for pain.     VITAMIN D PO  Take 2,000 Units by mouth daily.        Activity: as directed. No heavy lifting for 4 weeks and no contact sports for 6 weeks. Ambulation stressed. Diet: low fat, low cholesterol diet Wound Care: as directed  Follow-up:  With Dr. Derrell Lolling in 5 days.  Signed: Angelia Mould. Derrell Lolling, M.D., FACS General and minimally invasive surgery Breast and Colorectal Surgery  11/25/2012, 6:55 AM

## 2012-11-30 ENCOUNTER — Telehealth (INDEPENDENT_AMBULATORY_CARE_PROVIDER_SITE_OTHER): Payer: Self-pay | Admitting: General Surgery

## 2012-11-30 NOTE — Telephone Encounter (Signed)
Pt contacted office to request refill on her pain meds.  She states she is overall doing well; her mother is helping with dressing changes.  Per protocol, one automatic refill of pain medication (Norco 5/325 mg, # 30, 1-2 po Q4-6H prn pain, no refill,) called to [facility]:  [phone].  She has post op appt this week.

## 2012-12-01 ENCOUNTER — Ambulatory Visit (INDEPENDENT_AMBULATORY_CARE_PROVIDER_SITE_OTHER): Payer: BC Managed Care – PPO | Admitting: General Surgery

## 2012-12-01 ENCOUNTER — Encounter (INDEPENDENT_AMBULATORY_CARE_PROVIDER_SITE_OTHER): Payer: Self-pay | Admitting: General Surgery

## 2012-12-01 VITALS — BP 128/80 | HR 84 | Temp 99.1°F | Resp 15 | Ht 68.25 in | Wt 203.6 lb

## 2012-12-01 DIAGNOSIS — K5730 Diverticulosis of large intestine without perforation or abscess without bleeding: Secondary | ICD-10-CM

## 2012-12-01 NOTE — Progress Notes (Signed)
Patient ID: Misty Moran, female   DOB: [birth year], 47 y.o.   MRN: [id number] History: This patient underwent takedown splenic flexure and sigmoid colectomy on 11/18/2012. She had densely chronically inflamed sigmoid colon, palpable mass, difficult dissection. EEA stapled anastomosis. She did well and was discharged home after 7 days. She is having incisional pain on the left side. Appetite is good. Bowel movements are normal. Very soft. No fever or chills. No obvious wound problems.  Exam: Patient looks well. Vital signs stable. Anxious. Mother is with her Abdomen is soft. Nondistended. Midline wound looks good. Staples are removed. No sign of any complication or infection  Assessment: Severe, chronic diverticulitis with palpable mass, recovering without obvious complication following takedown splenic flexure and sigmoid colectomy and coloproctostomy Incisional pain, and anticipate this will be self-limited  Plan: Exercise hydration, high-fiber low-fat diet and ambulation. No sports or heavy lifting Return to see me in 6 weeks.    Angelia Mould. Derrell Lolling, M.D., [facility] General and Minimally invasive Surgery Breast and Colorectal Surgery Office:   [phone] Pager:   [phone]

## 2012-12-01 NOTE — Patient Instructions (Signed)
You appear to be recovering from your colon resection for diverticulitis without any obvious surgical complications.  You are having incisional pain from the muscular abdominal wall, and this will slowly resolved.  The staples were removed today.  You may continue taking a shower, but do not take tub baths yet.  Drink lots of water and follow a high-fiber, low-fat diet.  Take a 30 minute walk twice a day  Return to see Dr. Derrell Lolling in approximately 6 weeks.

## 2012-12-12 ENCOUNTER — Ambulatory Visit (HOSPITAL_COMMUNITY): Payer: BC Managed Care – PPO

## 2012-12-26 ENCOUNTER — Other Ambulatory Visit: Payer: BC Managed Care – PPO

## 2013-01-02 ENCOUNTER — Ambulatory Visit (INDEPENDENT_AMBULATORY_CARE_PROVIDER_SITE_OTHER): Payer: BC Managed Care – PPO | Admitting: Internal Medicine

## 2013-01-02 ENCOUNTER — Encounter: Payer: Self-pay | Admitting: Internal Medicine

## 2013-01-02 VITALS — BP 150/100 | HR 90 | Temp 97.8°F | Ht 67.25 in | Wt 207.0 lb

## 2013-01-02 DIAGNOSIS — N949 Unspecified condition associated with female genital organs and menstrual cycle: Secondary | ICD-10-CM

## 2013-01-02 DIAGNOSIS — N926 Irregular menstruation, unspecified: Secondary | ICD-10-CM

## 2013-01-02 DIAGNOSIS — D649 Anemia, unspecified: Secondary | ICD-10-CM

## 2013-01-02 DIAGNOSIS — Z0000 Encounter for general adult medical examination without abnormal findings: Secondary | ICD-10-CM

## 2013-01-02 LAB — CBC WITH DIFFERENTIAL/PLATELET
Basophils Absolute: 0 10*3/uL (ref 0.0–0.1)
Eosinophils Absolute: 0.1 10*3/uL (ref 0.0–0.7)
HCT: 30.3 % — ABNORMAL LOW (ref 36.0–46.0)
Hemoglobin: 9.6 g/dL — ABNORMAL LOW (ref 12.0–15.0)
Lymphs Abs: 1.3 10*3/uL (ref 0.7–4.0)
MCHC: 31.6 g/dL (ref 30.0–36.0)
Monocytes Relative: 7.2 % (ref 3.0–12.0)
Neutro Abs: 3.3 10*3/uL (ref 1.4–7.7)
Platelets: 274 10*3/uL (ref 150.0–400.0)
RDW: 24.2 % — ABNORMAL HIGH (ref 11.5–14.6)

## 2013-01-02 LAB — FERRITIN: Ferritin: 17 ng/mL (ref 10.0–291.0)

## 2013-01-02 NOTE — Progress Notes (Signed)
Chief Complaint  Patient presents with  . Annual Exam    HPI: Patient comes in today for Preventive Health Care visit  Had her bowel surgery  Resection  Summer  Had transfusion  Went well so far    Last ov dr Derrell Lolling: Assessment: Severe, chronic diverticulitis with palpable mass, recovering without obvious complication following takedown splenic flexure and sigmoid colectomy and coloproctostomy   Incisional pain, and anticipate this will be self-limited.   Right leg tends to swell up    No rhyme and reason.  3 dopplers  Negative   Has seen vein specialist in the past  Mild vv.  compression stocking  Periods long and heavy  Sees dr Gaynell Face had provera 325  Iron  And some miralax.  After the surgery . Stress at home  caretalking aunt with cancer   Her mom is there to help no where to get down time as she works from home. ROS:  GEN/ HEENT: No fever, significant weight changes sweats headaches vision problems hearing changes, CV/ PULM; No chest pain shortness of breath cough, syncope,edema  change in exercise tolerance. GI /GU: No adominal pain, vomiting, change in bowel habits. No blood in the stool. No significant GU symptoms. SKIN/HEME: ,no acute skin rashes suspicious lesions or bleeding. No lymphadenopathy, nodules, masses.  2 areas on scar  Not concerneing to follow  NEURO/ PSYCH:  No neurologic signs such as weakness numbness. No depression anxiety. IMM/ Allergy: No unusual infections.  Allergy .   REST of 12 system review negative except as per HPI   Past Medical History  Diagnosis Date  . Diverticulosis     5-6 years ago  . Anemia     ? from periods on iron a long time  . Thyroid disease     left side enlarged, biopsy done by dr Pollyann Kennedy 3 weeks ago, results normal  . Thyroid cancer   . Lesion of right native kidney     MRI right Bosniak type 3 under eval to have surgery to remove  . Swelling of right lower extremity hx of none currently  . Transfusion history 2014    with  partial bowel resection    Family History  Problem Relation Age of Onset  . Hypertension Mother   . Stroke Maternal Grandmother   . Breast cancer Maternal Grandmother     great  . Colon cancer Maternal Uncle     great  . Crohn's disease Maternal Grandmother     colitis  . Crohn's disease Maternal Aunt     colitis, diverticulosis  . Protein S deficiency Maternal Aunt    Past Surgical History  Procedure Laterality Date  . Cesarean section  1987 and 2002  . Thyroidectomy Right 1999    thyroid cancer  . Removal of renal lesion benign   2011  . Cosmetic surgery      tummy tuck breast reduction  lipossuction  . Breast reduction surgery Bilateral 2012  . Tummy tuck  2012  . Cholecystectomy  1987  . Biopsy thyroid Left 2014    Medial benign follicular lesion left  . Partial colectomy N/A 11/18/2012    Procedure: OPEN SIGMOID COLECTOMY, splenic flexure mobilization, rigid protoscope;  Surgeon: Ernestene Mention, MD;  Location: [facility];  Service: General;  Laterality: N/A;    History   Social History  . Marital Status: Divorced    Spouse Name: N/A    Number of Children: 2  . Years of Education: N/A  Occupational History  . self employed    Social History Main Topics  . Smoking status: Former Smoker -- 1.00 packs/day for 9 years    Types: Cigarettes    Quit date: 03/30/1989  . Smokeless tobacco: Never Used  . Alcohol Use: Yes     Comment: rare  . Drug Use: No  . Sexual Activity: None   Other Topics Concern  . 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           Outpatient Encounter Prescriptions as of 01/02/2013  Medication Sig Dispense Refill  . Cholecalciferol (VITAMIN D PO) Take 2,000 Units by mouth daily.      . ferrous sulfate 325 (65 FE) MG tablet Take 1 tablet (325 mg total) by mouth daily with breakfast.  30 tablet  2  .  Multiple Vitamin (MULTIVITAMIN WITH MINERALS) TABS tablet Take 1 tablet by mouth daily.      . [DISCONTINUED] medroxyPROGESTERone (PROVERA) 10 MG tablet Take 1 tablet (10 mg total) by mouth daily.  9 tablet  0  . [DISCONTINUED] oxyCODONE-acetaminophen (ROXICET) 5-325 MG per tablet Take 1 tablet by mouth every 4 (four) hours as needed for pain.  30 tablet  0   No facility-administered encounter medications on file as of 01/02/2013.    EXAM:  BP 150/100  Pulse 90  Temp(Src) 97.8 F (36.6 C) (Oral)  Ht 5' 7.25" (1.708 m)  Wt 207 lb (93.895 kg)  BMI 32.19 kg/m2  SpO2 99%  LMP 12/30/2012  Body mass index is 32.19 kg/(m^2).  Physical Exam: Vital signs reviewed HQI:ONGE is a well-developed well-nourished alert cooperative   female who appears her stated age in no acute distress.  HEENT: normocephalic atraumatic , Eyes: PERRL EOM's full, conjunctiva clear, Nares: paten,t no deformity discharge or tenderness., Ears: no deformity EAC's clear TMs with normal landmarks. Mouth: clear OP, no lesions, edema.  Moist mucous membranes. Dentition in adequate repair. NECK: supple without masses, thyromegaly or bruits. CHEST/PULM:  Clear to auscultation and percussion breath sounds equal no wheeze , rales or rhonchi. No chest wall deformities or tenderness. Breast: normal by inspection . No dimpling, discharge, masses, tenderness or discharge . Well healed reduction scar.  CV: PMI is nondisplaced, S1 S2 no gallops, murmurs, rubs. Peripheral pulses are full without delay.No JVD .  ABDOMEN: Bowel sounds normal nontender  No guard or rebound, no hepato splenomegal no CVA tenderness.  No hernia.well healed  Scars mid line abd 2 small grnaular areas no drainage no masses  Extremtities:  No clubbing cyanosis , no acute joint swelling or redness no focal atrophy mild edema right more than left  NEURO:  Oriented x3, cranial nerves 3-12 appear to be intact, no obvious focal weakness,gait within normal limits no  abnormal reflexes or asymmetrical SKIN: No acute rashes normal turgor, color, no bruising or petechiae. PSYCH: Oriented, good eye contact, no obvious depression anxiety, cognition and judgment appear normal. LN: no cervical axillary inguinal adenopathy  Lab Results  Component Value Date   WBC 5.0 01/02/2013   HGB 9.6* 01/02/2013   HCT 30.3* 01/02/2013   PLT 274.0 01/02/2013   GLUCOSE 94 11/23/2012   CHOL 176 08/16/2012   TRIG 36.0 08/16/2012   HDL 67.80 08/16/2012   LDLCALC 101*  08/16/2012   ALT 10 11/10/2012   AST 13 11/10/2012   NA 135 11/23/2012   K 3.7 11/23/2012   CL 102 11/23/2012   CREATININE 0.54 11/23/2012   BUN 3* 11/23/2012   CO2 25 11/23/2012   TSH 1.61 08/16/2012    ASSESSMENT AND PLAN:  Discussed the following assessment and plan:  Visit for preventive health examination - declined flu vaccine - Plan: HM PAP SMEAR, CBC with Differential, Ferritin  Prolonged periods - Plan: HM PAP SMEAR, CBC with Differential, Ferritin  Anemia - prob iron defic from menstrual bleeding sp transfusion in hospital  but heavy menses  now on iron once a day - Plan: HM PAP SMEAR, CBC with Differential, Ferritin S/P bowel resection.   recovering seems to be doing well  Elevated bp readings today  Feels from coffee  Today has been normal readings throughout hosp etc  Small amount granulation  Tissue noted  May resolve surgery can fu if needed. Patient Care Team: Madelin Headings, MD as PCP - General Serena Colonel, MD as Consulting Physician (Otolaryngology) Ernestene Mention, MD as Consulting Physician (General Surgery) Kathreen Cosier, MD as Consulting Physician (Obstetrics and Gynecology) Patient Instructions  Recheck for the anemia .   Today    Fu with gyne if  Bleeding is till an issue. Compression stocking  When working legs down . Elevate as possible exercise also will help.  Preventive Care for Adults, Female A healthy lifestyle and preventive care can promote health and wellness.  Preventive health guidelines for women include the following key practices.  A routine yearly physical is a good way to check with your caregiver about your health and preventive screening. It is a chance to share any concerns and updates on your health, and to receive a thorough exam.  Visit your dentist for a routine exam and preventive care every 6 months. Brush your teeth twice a day and floss once a day. Good oral hygiene prevents tooth decay and gum disease.  The frequency of eye exams is based on your age, health, family medical history, use of contact lenses, and other factors. Follow your caregiver's recommendations for frequency of eye exams.  Eat a healthy diet. Foods like vegetables, fruits, whole grains, low-fat dairy products, and lean protein foods contain the nutrients you need without too many calories. Decrease your intake of foods high in solid fats, added sugars, and salt. Eat the right amount of calories for you.Get information about a proper diet from your caregiver, if necessary.  Regular physical exercise is one of the most important things you can do for your health. Most adults should get at least 150 minutes of moderate-intensity exercise (any activity that increases your heart rate and causes you to sweat) each week. In addition, most adults need muscle-strengthening exercises on 2 or more days a week.  Maintain a healthy weight. The body mass index (BMI) is a screening tool to identify possible weight problems. It provides an estimate of body fat based on height and weight. Your caregiver can help determine your BMI, and can help you achieve or maintain a healthy weight.For adults 20 years and older:  A BMI below 18.5 is considered underweight.  A BMI of 18.5 to 24.9 is normal.  A BMI of 25 to 29.9 is considered overweight.  A BMI of 30 and above is considered obese.  Maintain normal blood lipids and cholesterol levels by exercising and minimizing your intake of  saturated fat. Eat a balanced  diet with plenty of fruit and vegetables. Blood tests for lipids and cholesterol should begin at age 26 and be repeated every 5 years. If your lipid or cholesterol levels are high, you are over 50, or you are at high risk for heart disease, you may need your cholesterol levels checked more frequently.Ongoing high lipid and cholesterol levels should be treated with medicines if diet and exercise are not effective.  If you smoke, find out from your caregiver how to quit. If you do not use tobacco, do not start.  If you are pregnant, do not drink alcohol. If you are breastfeeding, be very cautious about drinking alcohol. If you are not pregnant and choose to drink alcohol, do not exceed 1 drink per day. One drink is considered to be 12 ounces (355 mL) of beer, 5 ounces (148 mL) of wine, or 1.5 ounces (44 mL) of liquor.  Avoid use of street drugs. Do not share needles with anyone. Ask for help if you need support or instructions about stopping the use of drugs.  High blood pressure causes heart disease and increases the risk of stroke. Your blood pressure should be checked at least every 1 to 2 years. Ongoing high blood pressure should be treated with medicines if weight loss and exercise are not effective.  If you are 89 to 48 years old, ask your caregiver if you should take aspirin to prevent strokes.  Diabetes screening involves taking a blood sample to check your fasting blood sugar level. This should be done once every 3 years, after age 42, if you are within normal weight and without risk factors for diabetes. Testing should be considered at a younger age or be carried out more frequently if you are overweight and have at least 1 risk factor for diabetes.  Breast cancer screening is essential preventive care for women. You should practice "breast self-awareness." This means understanding the normal appearance and feel of your breasts and may include breast  self-examination. Any changes detected, no matter how small, should be reported to a caregiver. Women in their 85s and 30s should have a clinical breast exam (CBE) by a caregiver as part of a regular health exam every 1 to 3 years. After age 71, women should have a CBE every year. Starting at age 46, women should consider having a mammography (breast X-ray test) every year. Women who have a family history of breast cancer should talk to their caregiver about genetic screening. Women at a high risk of breast cancer should talk to their caregivers about having magnetic resonance imaging (MRI) and a mammography every year.  The Pap test is a screening test for cervical cancer. A Pap test can show cell changes on the cervix that might become cervical cancer if left untreated. A Pap test is a procedure in which cells are obtained and examined from the lower end of the uterus (cervix).  Women should have a Pap test starting at age 70.  Between ages 22 and 52, Pap tests should be repeated every 2 years.  Beginning at age 3, you should have a Pap test every 3 years as long as the past 3 Pap tests have been normal.  Some women have medical problems that increase the chance of getting cervical cancer. Talk to your caregiver about these problems. It is especially important to talk to your caregiver if a new problem develops soon after your last Pap test. In these cases, your caregiver may recommend more frequent screening and Pap  tests.  The above recommendations are the same for women who have or have not gotten the vaccine for human papillomavirus (HPV).  If you had a hysterectomy for a problem that was not cancer or a condition that could lead to cancer, then you no longer need Pap tests. Even if you no longer need a Pap test, a regular exam is a good idea to make sure no other problems are starting.  If you are between ages 12 and 16, and you have had normal Pap tests going back 10 years, you no longer  need Pap tests. Even if you no longer need a Pap test, a regular exam is a good idea to make sure no other problems are starting.  If you have had past treatment for cervical cancer or a condition that could lead to cancer, you need Pap tests and screening for cancer for at least 20 years after your treatment.  If Pap tests have been discontinued, risk factors (such as a new sexual partner) need to be reassessed to determine if screening should be resumed.  The HPV test is an additional test that may be used for cervical cancer screening. The HPV test looks for the virus that can cause the cell changes on the cervix. The cells collected during the Pap test can be tested for HPV. The HPV test could be used to screen women aged 37 years and older, and should be used in women of any age who have unclear Pap test results. After the age of 50, women should have HPV testing at the same frequency as a Pap test.  Colorectal cancer can be detected and often prevented. Most routine colorectal cancer screening begins at the age of 31 and continues through age 52. However, your caregiver may recommend screening at an earlier age if you have risk factors for colon cancer. On a yearly basis, your caregiver may provide home test kits to check for hidden blood in the stool. Use of a small camera at the end of a tube, to directly examine the colon (sigmoidoscopy or colonoscopy), can detect the earliest forms of colorectal cancer. Talk to your caregiver about this at age 51, when routine screening begins. Direct examination of the colon should be repeated every 5 to 10 years through age 7, unless early forms of pre-cancerous polyps or small growths are found.  Hepatitis C blood testing is recommended for all people born from [birth year] through 1965 and any individual with known risks for hepatitis C.  Practice safe sex. Use condoms and avoid high-risk sexual practices to reduce the spread of sexually transmitted infections  (STIs). STIs include gonorrhea, chlamydia, syphilis, trichomonas, herpes, HPV, and human immunodeficiency virus (HIV). Herpes, HIV, and HPV are viral illnesses that have no cure. They can result in disability, cancer, and death. Sexually active women aged 68 and younger should be checked for chlamydia. Older women with new or multiple partners should also be tested for chlamydia. Testing for other STIs is recommended if you are sexually active and at increased risk.  Osteoporosis is a disease in which the bones lose minerals and strength with aging. This can result in serious bone fractures. The risk of osteoporosis can be identified using a bone density scan. Women ages 88 and over and women at risk for fractures or osteoporosis should discuss screening with their caregivers. Ask your caregiver whether you should take a calcium supplement or vitamin D to reduce the rate of osteoporosis.  Menopause can be associated with  physical symptoms and risks. Hormone replacement therapy is available to decrease symptoms and risks. You should talk to your caregiver about whether hormone replacement therapy is right for you.  Use sunscreen with sun protection factor (SPF) of 30 or more. Apply sunscreen liberally and repeatedly throughout the day. You should seek shade when your shadow is shorter than you. Protect yourself by wearing long sleeves, pants, a wide-brimmed hat, and sunglasses year round, whenever you are outdoors.  Once a month, do a whole body skin exam, using a mirror to look at the skin on your back. Notify your caregiver of new moles, moles that have irregular borders, moles that are larger than a pencil eraser, or moles that have changed in shape or color.  Stay current with required immunizations.  Influenza. You need a dose every fall (or winter). The composition of the flu vaccine changes each year, so being vaccinated once is not enough.  Pneumococcal polysaccharide. You need 1 to 2 doses if  you smoke cigarettes or if you have certain chronic medical conditions. You need 1 dose at age 56 (or older) if you have never been vaccinated.  Tetanus, diphtheria, pertussis (Tdap, Td). Get 1 dose of Tdap vaccine if you are younger than age 44, are over 48 and have contact with an infant, are a Research scientist (physical sciences), are pregnant, or simply want to be protected from whooping cough. After that, you need a Td booster dose every 10 years. Consult your caregiver if you have not had at least 3 tetanus and diphtheria-containing shots sometime in your life or have a deep or dirty wound.  HPV. You need this vaccine if you are a woman age 54 or younger. The vaccine is given in 3 doses over 6 months.  Measles, mumps, rubella (MMR). You need at least 1 dose of MMR if you were born in 1957 or later. You may also need a second dose.  Meningococcal. If you are age 66 to 84 and a first-year college student living in a residence hall, or have one of several medical conditions, you need to get vaccinated against meningococcal disease. You may also need additional booster doses.  Zoster (shingles). If you are age 47 or older, you should get this vaccine.  Varicella (chickenpox). If you have never had chickenpox or you were vaccinated but received only 1 dose, talk to your caregiver to find out if you need this vaccine.  Hepatitis A. You need this vaccine if you have a specific risk factor for hepatitis A virus infection or you simply wish to be protected from this disease. The vaccine is usually given as 2 doses, 6 to 18 months apart.  Hepatitis B. You need this vaccine if you have a specific risk factor for hepatitis B virus infection or you simply wish to be protected from this disease. The vaccine is given in 3 doses, usually over 6 months. Preventive Services / Frequency Ages 30 to 76  Blood pressure check.** / Every 1 to 2 years.  Lipid and cholesterol check.** / Every 5 years beginning at age 12.  Clinical  breast exam.** / Every 3 years for women in their 46s and 30s.  Pap test.** / Every 2 years from ages 4 through 49. Every 3 years starting at age 54 through age 78 or 61 with a history of 3 consecutive normal Pap tests.  HPV screening.** / Every 3 years from ages 55 through ages 84 to 74 with a history of 3 consecutive normal Pap  tests.  Hepatitis C blood test.** / For any individual with known risks for hepatitis C.  Skin self-exam. / Monthly.  Influenza immunization.** / Every year.  Pneumococcal polysaccharide immunization.** / 1 to 2 doses if you smoke cigarettes or if you have certain chronic medical conditions.  Tetanus, diphtheria, pertussis (Tdap, Td) immunization. / A one-time dose of Tdap vaccine. After that, you need a Td booster dose every 10 years.  HPV immunization. / 3 doses over 6 months, if you are 75 and younger.  Measles, mumps, rubella (MMR) immunization. / You need at least 1 dose of MMR if you were born in 1957 or later. You may also need a second dose.  Meningococcal immunization. / 1 dose if you are age 31 to 29 and a first-year college student living in a residence hall, or have one of several medical conditions, you need to get vaccinated against meningococcal disease. You may also need additional booster doses.  Varicella immunization.** / Consult your caregiver.  Hepatitis A immunization.** / Consult your caregiver. 2 doses, 6 to 18 months apart.  Hepatitis B immunization.** / Consult your caregiver. 3 doses usually over 6 months. Ages 3 to 15  Blood pressure check.** / Every 1 to 2 years.  Lipid and cholesterol check.** / Every 5 years beginning at age 61.  Clinical breast exam.** / Every year after age 110.  Mammogram.** / Every year beginning at age 10 and continuing for as long as you are in good health. Consult with your caregiver.  Pap test.** / Every 3 years starting at age 16 through age 19 or 51 with a history of 3 consecutive normal Pap  tests.  HPV screening.** / Every 3 years from ages 41 through ages 79 to 41 with a history of 3 consecutive normal Pap tests.  Fecal occult blood test (FOBT) of stool. / Every year beginning at age 77 and continuing until age 87. You may not need to do this test if you get a colonoscopy every 10 years.  Flexible sigmoidoscopy or colonoscopy.** / Every 5 years for a flexible sigmoidoscopy or every 10 years for a colonoscopy beginning at age 38 and continuing until age 15.  Hepatitis C blood test.** / For all people born from [birth year] through 1967 and any individual with known risks for hepatitis C.  Skin self-exam. / Monthly.  Influenza immunization.** / Every year.  Pneumococcal polysaccharide immunization.** / 1 to 2 doses if you smoke cigarettes or if you have certain chronic medical conditions.  Tetanus, diphtheria, pertussis (Tdap, Td) immunization.** / A one-time dose of Tdap vaccine. After that, you need a Td booster dose every 10 years.  Measles, mumps, rubella (MMR) immunization. / You need at least 1 dose of MMR if you were born in 1957 or later. You may also need a second dose.  Varicella immunization.** / Consult your caregiver.  Meningococcal immunization.** / Consult your caregiver.  Hepatitis A immunization.** / Consult your caregiver. 2 doses, 6 to 18 months apart.  Hepatitis B immunization.** / Consult your caregiver. 3 doses, usually over 6 months. Ages 66 and over  Blood pressure check.** / Every 1 to 2 years.  Lipid and cholesterol check.** / Every 5 years beginning at age 53.  Clinical breast exam.** / Every year after age 23.  Mammogram.** / Every year beginning at age 90 and continuing for as long as you are in good health. Consult with your caregiver.  Pap test.** / Every 3 years starting at age 15  through age 102 or 20 with a 3 consecutive normal Pap tests. Testing can be stopped between 65 and 70 with 3 consecutive normal Pap tests and no abnormal Pap or HPV  tests in the past 10 years.  HPV screening.** / Every 3 years from ages 19 through ages 70 or 70 with a history of 3 consecutive normal Pap tests. Testing can be stopped between 65 and 70 with 3 consecutive normal Pap tests and no abnormal Pap or HPV tests in the past 10 years.  Fecal occult blood test (FOBT) of stool. / Every year beginning at age 66 and continuing until age 61. You may not need to do this test if you get a colonoscopy every 10 years.  Flexible sigmoidoscopy or colonoscopy.** / Every 5 years for a flexible sigmoidoscopy or every 10 years for a colonoscopy beginning at age 25 and continuing until age 38.  Hepatitis C blood test.** / For all people born from [birth year] through 1967 and any individual with known risks for hepatitis C.  Osteoporosis screening.** / A one-time screening for women ages 58 and over and women at risk for fractures or osteoporosis.  Skin self-exam. / Monthly.  Influenza immunization.** / Every year.  Pneumococcal polysaccharide immunization.** / 1 dose at age 84 (or older) if you have never been vaccinated.  Tetanus, diphtheria, pertussis (Tdap, Td) immunization. / A one-time dose of Tdap vaccine if you are over 65 and have contact with an infant, are a Research scientist (physical sciences), or simply want to be protected from whooping cough. After that, you need a Td booster dose every 10 years.  Varicella immunization.** / Consult your caregiver.  Meningococcal immunization.** / Consult your caregiver.  Hepatitis A immunization.** / Consult your caregiver. 2 doses, 6 to 18 months apart.  Hepatitis B immunization.** / Check with your caregiver. 3 doses, usually over 6 months. ** Family history and personal history of risk and conditions may change your caregiver's recommendations. Document Released: 05/12/2001 Document Revised: 06/08/2011 Document Reviewed: 08/11/2010 [facility] Patient Information 2014 [place], [place].     Neta Mends. [place] M.D.  Health Maintenance   Topic Date Due  . Influenza Vaccine  11/29/2013  . Pap Smear  08/30/2015  . Tetanus/tdap  10/09/2019   Health Maintenance Review

## 2013-01-02 NOTE — Patient Instructions (Addendum)
Recheck for the anemia .   Today    Fu with gyne if  Bleeding is till an issue. Compression stocking  When working legs down . Elevate as possible exercise also will help.  Preventive Care for Adults, Female A healthy lifestyle and preventive care can promote health and wellness. Preventive health guidelines for women include the following key practices.  A routine yearly physical is a good way to check with your caregiver about your health and preventive screening. It is a chance to share any concerns and updates on your health, and to receive a thorough exam.  Visit your dentist for a routine exam and preventive care every 6 months. Brush your teeth twice a day and floss once a day. Good oral hygiene prevents tooth decay and gum disease.  The frequency of eye exams is based on your age, health, family medical history, use of contact lenses, and other factors. Follow your caregiver's recommendations for frequency of eye exams.  Eat a healthy diet. Foods like vegetables, fruits, whole grains, low-fat dairy products, and lean protein foods contain the nutrients you need without too many calories. Decrease your intake of foods high in solid fats, added sugars, and salt. Eat the right amount of calories for you.Get information about a proper diet from your caregiver, if necessary.  Regular physical exercise is one of the most important things you can do for your health. Most adults should get at least 150 minutes of moderate-intensity exercise (any activity that increases your heart rate and causes you to sweat) each week. In addition, most adults need muscle-strengthening exercises on 2 or more days a week.  Maintain a healthy weight. The body mass index (BMI) is a screening tool to identify possible weight problems. It provides an estimate of body fat based on height and weight. Your caregiver can help determine your BMI, and can help you achieve or maintain a healthy weight.For adults 20 years  and older:  A BMI below 18.5 is considered underweight.  A BMI of 18.5 to 24.9 is normal.  A BMI of 25 to 29.9 is considered overweight.  A BMI of 30 and above is considered obese.  Maintain normal blood lipids and cholesterol levels by exercising and minimizing your intake of saturated fat. Eat a balanced diet with plenty of fruit and vegetables. Blood tests for lipids and cholesterol should begin at age 31 and be repeated every 5 years. If your lipid or cholesterol levels are high, you are over 50, or you are at high risk for heart disease, you may need your cholesterol levels checked more frequently.Ongoing high lipid and cholesterol levels should be treated with medicines if diet and exercise are not effective.  If you smoke, find out from your caregiver how to quit. If you do not use tobacco, do not start.  If you are pregnant, do not drink alcohol. If you are breastfeeding, be very cautious about drinking alcohol. If you are not pregnant and choose to drink alcohol, do not exceed 1 drink per day. One drink is considered to be 12 ounces (355 mL) of beer, 5 ounces (148 mL) of wine, or 1.5 ounces (44 mL) of liquor.  Avoid use of street drugs. Do not share needles with anyone. Ask for help if you need support or instructions about stopping the use of drugs.  High blood pressure causes heart disease and increases the risk of stroke. Your blood pressure should be checked at least every 1 to 2 years. Ongoing high  blood pressure should be treated with medicines if weight loss and exercise are not effective.  If you are 35 to 49 years old, ask your caregiver if you should take aspirin to prevent strokes.  Diabetes screening involves taking a blood sample to check your fasting blood sugar level. This should be done once every 3 years, after age 44, if you are within normal weight and without risk factors for diabetes. Testing should be considered at a younger age or be carried out more frequently  if you are overweight and have at least 1 risk factor for diabetes.  Breast cancer screening is essential preventive care for women. You should practice "breast self-awareness." This means understanding the normal appearance and feel of your breasts and may include breast self-examination. Any changes detected, no matter how small, should be reported to a caregiver. Women in their 69s and 30s should have a clinical breast exam (CBE) by a caregiver as part of a regular health exam every 1 to 3 years. After age 75, women should have a CBE every year. Starting at age 80, women should consider having a mammography (breast X-ray test) every year. Women who have a family history of breast cancer should talk to their caregiver about genetic screening. Women at a high risk of breast cancer should talk to their caregivers about having magnetic resonance imaging (MRI) and a mammography every year.  The Pap test is a screening test for cervical cancer. A Pap test can show cell changes on the cervix that might become cervical cancer if left untreated. A Pap test is a procedure in which cells are obtained and examined from the lower end of the uterus (cervix).  Women should have a Pap test starting at age 76.  Between ages 21 and 66, Pap tests should be repeated every 2 years.  Beginning at age 42, you should have a Pap test every 3 years as long as the past 3 Pap tests have been normal.  Some women have medical problems that increase the chance of getting cervical cancer. Talk to your caregiver about these problems. It is especially important to talk to your caregiver if a new problem develops soon after your last Pap test. In these cases, your caregiver may recommend more frequent screening and Pap tests.  The above recommendations are the same for women who have or have not gotten the vaccine for human papillomavirus (HPV).  If you had a hysterectomy for a problem that was not cancer or a condition that could  lead to cancer, then you no longer need Pap tests. Even if you no longer need a Pap test, a regular exam is a good idea to make sure no other problems are starting.  If you are between ages 42 and 77, and you have had normal Pap tests going back 10 years, you no longer need Pap tests. Even if you no longer need a Pap test, a regular exam is a good idea to make sure no other problems are starting.  If you have had past treatment for cervical cancer or a condition that could lead to cancer, you need Pap tests and screening for cancer for at least 20 years after your treatment.  If Pap tests have been discontinued, risk factors (such as a new sexual partner) need to be reassessed to determine if screening should be resumed.  The HPV test is an additional test that may be used for cervical cancer screening. The HPV test looks for the virus  that can cause the cell changes on the cervix. The cells collected during the Pap test can be tested for HPV. The HPV test could be used to screen women aged 9 years and older, and should be used in women of any age who have unclear Pap test results. After the age of 44, women should have HPV testing at the same frequency as a Pap test.  Colorectal cancer can be detected and often prevented. Most routine colorectal cancer screening begins at the age of 66 and continues through age 65. However, your caregiver may recommend screening at an earlier age if you have risk factors for colon cancer. On a yearly basis, your caregiver may provide home test kits to check for hidden blood in the stool. Use of a small camera at the end of a tube, to directly examine the colon (sigmoidoscopy or colonoscopy), can detect the earliest forms of colorectal cancer. Talk to your caregiver about this at age 87, when routine screening begins. Direct examination of the colon should be repeated every 5 to 10 years through age 32, unless early forms of pre-cancerous polyps or small growths are  found.  Hepatitis C blood testing is recommended for all people born from [birth year] through 1965 and any individual with known risks for hepatitis C.  Practice safe sex. Use condoms and avoid high-risk sexual practices to reduce the spread of sexually transmitted infections (STIs). STIs include gonorrhea, chlamydia, syphilis, trichomonas, herpes, HPV, and human immunodeficiency virus (HIV). Herpes, HIV, and HPV are viral illnesses that have no cure. They can result in disability, cancer, and death. Sexually active women aged 9 and younger should be checked for chlamydia. Older women with new or multiple partners should also be tested for chlamydia. Testing for other STIs is recommended if you are sexually active and at increased risk.  Osteoporosis is a disease in which the bones lose minerals and strength with aging. This can result in serious bone fractures. The risk of osteoporosis can be identified using a bone density scan. Women ages 44 and over and women at risk for fractures or osteoporosis should discuss screening with their caregivers. Ask your caregiver whether you should take a calcium supplement or vitamin D to reduce the rate of osteoporosis.  Menopause can be associated with physical symptoms and risks. Hormone replacement therapy is available to decrease symptoms and risks. You should talk to your caregiver about whether hormone replacement therapy is right for you.  Use sunscreen with sun protection factor (SPF) of 30 or more. Apply sunscreen liberally and repeatedly throughout the day. You should seek shade when your shadow is shorter than you. Protect yourself by wearing long sleeves, pants, a wide-brimmed hat, and sunglasses year round, whenever you are outdoors.  Once a month, do a whole body skin exam, using a mirror to look at the skin on your back. Notify your caregiver of new moles, moles that have irregular borders, moles that are larger than a pencil eraser, or moles that have  changed in shape or color.  Stay current with required immunizations.  Influenza. You need a dose every fall (or winter). The composition of the flu vaccine changes each year, so being vaccinated once is not enough.  Pneumococcal polysaccharide. You need 1 to 2 doses if you smoke cigarettes or if you have certain chronic medical conditions. You need 1 dose at age 41 (or older) if you have never been vaccinated.  Tetanus, diphtheria, pertussis (Tdap, Td). Get 1 dose of Tdap vaccine  if you are younger than age 50, are over 74 and have contact with an infant, are a Research scientist (physical sciences), are pregnant, or simply want to be protected from whooping cough. After that, you need a Td booster dose every 10 years. Consult your caregiver if you have not had at least 3 tetanus and diphtheria-containing shots sometime in your life or have a deep or dirty wound.  HPV. You need this vaccine if you are a woman age 26 or younger. The vaccine is given in 3 doses over 6 months.  Measles, mumps, rubella (MMR). You need at least 1 dose of MMR if you were born in 1957 or later. You may also need a second dose.  Meningococcal. If you are age 31 to 73 and a first-year college student living in a residence hall, or have one of several medical conditions, you need to get vaccinated against meningococcal disease. You may also need additional booster doses.  Zoster (shingles). If you are age 43 or older, you should get this vaccine.  Varicella (chickenpox). If you have never had chickenpox or you were vaccinated but received only 1 dose, talk to your caregiver to find out if you need this vaccine.  Hepatitis A. You need this vaccine if you have a specific risk factor for hepatitis A virus infection or you simply wish to be protected from this disease. The vaccine is usually given as 2 doses, 6 to 18 months apart.  Hepatitis B. You need this vaccine if you have a specific risk factor for hepatitis B virus infection or you  simply wish to be protected from this disease. The vaccine is given in 3 doses, usually over 6 months. Preventive Services / Frequency Ages 6 to 31  Blood pressure check.** / Every 1 to 2 years.  Lipid and cholesterol check.** / Every 5 years beginning at age 10.  Clinical breast exam.** / Every 3 years for women in their 49s and 30s.  Pap test.** / Every 2 years from ages 80 through 87. Every 3 years starting at age 61 through age 58 or 54 with a history of 3 consecutive normal Pap tests.  HPV screening.** / Every 3 years from ages 54 through ages 53 to 35 with a history of 3 consecutive normal Pap tests.  Hepatitis C blood test.** / For any individual with known risks for hepatitis C.  Skin self-exam. / Monthly.  Influenza immunization.** / Every year.  Pneumococcal polysaccharide immunization.** / 1 to 2 doses if you smoke cigarettes or if you have certain chronic medical conditions.  Tetanus, diphtheria, pertussis (Tdap, Td) immunization. / A one-time dose of Tdap vaccine. After that, you need a Td booster dose every 10 years.  HPV immunization. / 3 doses over 6 months, if you are 10 and younger.  Measles, mumps, rubella (MMR) immunization. / You need at least 1 dose of MMR if you were born in 1957 or later. You may also need a second dose.  Meningococcal immunization. / 1 dose if you are age 71 to 66 and a first-year college student living in a residence hall, or have one of several medical conditions, you need to get vaccinated against meningococcal disease. You may also need additional booster doses.  Varicella immunization.** / Consult your caregiver.  Hepatitis A immunization.** / Consult your caregiver. 2 doses, 6 to 18 months apart.  Hepatitis B immunization.** / Consult your caregiver. 3 doses usually over 6 months. Ages 56 to 40  Blood pressure check.** /  Every 1 to 2 years.  Lipid and cholesterol check.** / Every 5 years beginning at age 44.  Clinical breast  exam.** / Every year after age 78.  Mammogram.** / Every year beginning at age 53 and continuing for as long as you are in good health. Consult with your caregiver.  Pap test.** / Every 3 years starting at age 44 through age 44 or 6 with a history of 3 consecutive normal Pap tests.  HPV screening.** / Every 3 years from ages 62 through ages 42 to 65 with a history of 3 consecutive normal Pap tests.  Fecal occult blood test (FOBT) of stool. / Every year beginning at age 60 and continuing until age 7. You may not need to do this test if you get a colonoscopy every 10 years.  Flexible sigmoidoscopy or colonoscopy.** / Every 5 years for a flexible sigmoidoscopy or every 10 years for a colonoscopy beginning at age 76 and continuing until age 2.  Hepatitis C blood test.** / For all people born from [birth year] through 1967 and any individual with known risks for hepatitis C.  Skin self-exam. / Monthly.  Influenza immunization.** / Every year.  Pneumococcal polysaccharide immunization.** / 1 to 2 doses if you smoke cigarettes or if you have certain chronic medical conditions.  Tetanus, diphtheria, pertussis (Tdap, Td) immunization.** / A one-time dose of Tdap vaccine. After that, you need a Td booster dose every 10 years.  Measles, mumps, rubella (MMR) immunization. / You need at least 1 dose of MMR if you were born in 1957 or later. You may also need a second dose.  Varicella immunization.** / Consult your caregiver.  Meningococcal immunization.** / Consult your caregiver.  Hepatitis A immunization.** / Consult your caregiver. 2 doses, 6 to 18 months apart.  Hepatitis B immunization.** / Consult your caregiver. 3 doses, usually over 6 months. Ages 64 and over  Blood pressure check.** / Every 1 to 2 years.  Lipid and cholesterol check.** / Every 5 years beginning at age 41.  Clinical breast exam.** / Every year after age 38.  Mammogram.** / Every year beginning at age 57 and continuing  for as long as you are in good health. Consult with your caregiver.  Pap test.** / Every 3 years starting at age 30 through age 12 or 23 with a 3 consecutive normal Pap tests. Testing can be stopped between 65 and 70 with 3 consecutive normal Pap tests and no abnormal Pap or HPV tests in the past 10 years.  HPV screening.** / Every 3 years from ages 68 through ages 62 or 4 with a history of 3 consecutive normal Pap tests. Testing can be stopped between 65 and 70 with 3 consecutive normal Pap tests and no abnormal Pap or HPV tests in the past 10 years.  Fecal occult blood test (FOBT) of stool. / Every year beginning at age 50 and continuing until age 14. You may not need to do this test if you get a colonoscopy every 10 years.  Flexible sigmoidoscopy or colonoscopy.** / Every 5 years for a flexible sigmoidoscopy or every 10 years for a colonoscopy beginning at age 10 and continuing until age 86.  Hepatitis C blood test.** / For all people born from [birth year] through 1967 and any individual with known risks for hepatitis C.  Osteoporosis screening.** / A one-time screening for women ages 28 and over and women at risk for fractures or osteoporosis.  Skin self-exam. / Monthly.  Influenza immunization.** / Every  year.  Pneumococcal polysaccharide immunization.** / 1 dose at age 72 (or older) if you have never been vaccinated.  Tetanus, diphtheria, pertussis (Tdap, Td) immunization. / A one-time dose of Tdap vaccine if you are over 65 and have contact with an infant, are a Research scientist (physical sciences), or simply want to be protected from whooping cough. After that, you need a Td booster dose every 10 years.  Varicella immunization.** / Consult your caregiver.  Meningococcal immunization.** / Consult your caregiver.  Hepatitis A immunization.** / Consult your caregiver. 2 doses, 6 to 18 months apart.  Hepatitis B immunization.** / Check with your caregiver. 3 doses, usually over 6 months. ** Family history  and personal history of risk and conditions may change your caregiver's recommendations. Document Released: 05/12/2001 Document Revised: 06/08/2011 Document Reviewed: 08/11/2010 [facility] Patient Information 2014 [place], [place].

## 2013-01-13 ENCOUNTER — Ambulatory Visit (INDEPENDENT_AMBULATORY_CARE_PROVIDER_SITE_OTHER): Payer: BC Managed Care – PPO | Admitting: General Surgery

## 2013-01-13 ENCOUNTER — Encounter (INDEPENDENT_AMBULATORY_CARE_PROVIDER_SITE_OTHER): Payer: Self-pay | Admitting: General Surgery

## 2013-01-13 VITALS — BP 140/80 | HR 72 | Temp 97.4°F | Resp 14 | Ht 68.25 in | Wt 210.8 lb

## 2013-01-13 DIAGNOSIS — K5730 Diverticulosis of large intestine without perforation or abscess without bleeding: Secondary | ICD-10-CM

## 2013-01-13 NOTE — Patient Instructions (Signed)
You have recovered from your colon surgery without any obvious complications.  Follow a high-fiber, low-fat diet with lots of hydration  You may resume normal physical activities without restriction  I advised that you get another colonoscopy at age 47  Return to see Dr. Derrell Lolling as needed.

## 2013-01-13 NOTE — Progress Notes (Signed)
Patient ID: Misty Moran, female   DOB: Jul 15, 1965, 47 y.o.   MRN: [id number]  History: This patient underwent takedown splenic flexure and sigmoid colectomy on 11/18/2012. She had densely chronically inflamed sigmoid colon, palpable mass, difficult dissection. EEA stapled anastomosis. She did well and was discharged home after 7 days.  She feels much better now. She has no pain. No wound problems. Appetite normal. Having solid bowel movements.  Exam: Patient looks well. Good spirits. Upbeat as usual. Abdomen is soft. Nondistended. Midline wound looks good.No hernia  Soft. Nontender. No mass.  Assessment: Severe, chronic diverticulitis with palpable mass, recovering without obvious complication following takedown splenic flexure and sigmoid colectomy and coloproctostomy   Plan:  exercise, No restriction hydration, high-fiber low-fat diet and ambulation.  Advised colonoscopy with Dr. Marina Goodell  no later than age 29. This will be 5 years since her last colonoscopy Return to see me as needed.    Angelia Mould. Derrell Lolling, M.D., [facility] General and Minimally invasive Surgery Breast and Colorectal Surgery Office:   [phone] Pager:   [phone]    .

## 2013-03-27 ENCOUNTER — Other Ambulatory Visit: Payer: Self-pay | Admitting: Obstetrics

## 2013-04-19 ENCOUNTER — Ambulatory Visit: Admit: 2013-04-19 | Payer: BC Managed Care – PPO | Admitting: Obstetrics

## 2013-04-19 SURGERY — DILATATION & CURETTAGE/HYSTEROSCOPY WITH HYDROTHERMAL ABLATION
Anesthesia: Choice

## 2013-06-28 ENCOUNTER — Other Ambulatory Visit: Payer: Self-pay | Admitting: Internal Medicine

## 2013-10-18 ENCOUNTER — Other Ambulatory Visit: Payer: Self-pay | Admitting: Otolaryngology

## 2013-10-18 DIAGNOSIS — D497 Neoplasm of unspecified behavior of endocrine glands and other parts of nervous system: Secondary | ICD-10-CM

## 2014-01-29 ENCOUNTER — Encounter (INDEPENDENT_AMBULATORY_CARE_PROVIDER_SITE_OTHER): Payer: Self-pay | Admitting: General Surgery

## 2015-01-11 IMAGING — RF DG BE W/ CM - WO/W KUB
18 of 24 series · 18 of 24 positions shown · IV contrast (agent unspecified)
Comparison: CT abdomen and pelvis 08/18/2012.

CLINICAL DATA: 46-year-old female with history of sigmoid
diverticulitis and scarring.  Recent colonoscopy revealing sigmoid
stricture.  Preoperative study in anticipation of partial
colectomy.

BE WITH CONTRAST - AND KUB
TECHNIQUE: Preprocedural upright and supine abdomen radiographs
plus a single contrast barium enema performed.
Fluoroscopy time 2 minutes 12 seconds.

[Series 1: run · 1 of 1 slices shown (1 of 18)]
[im 1/1]
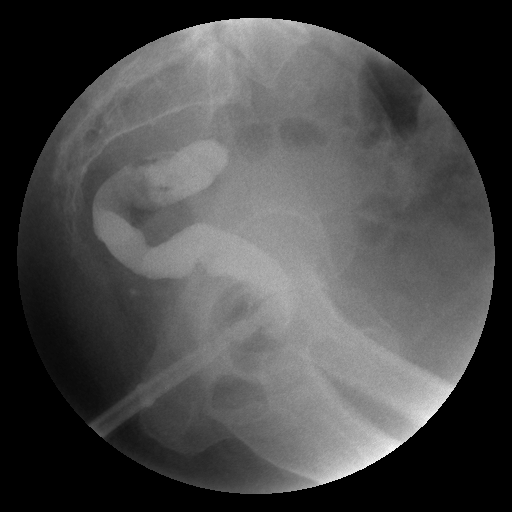

[Series 3: run · 1 of 1 slices shown (2 of 18)]
[im 1/1]
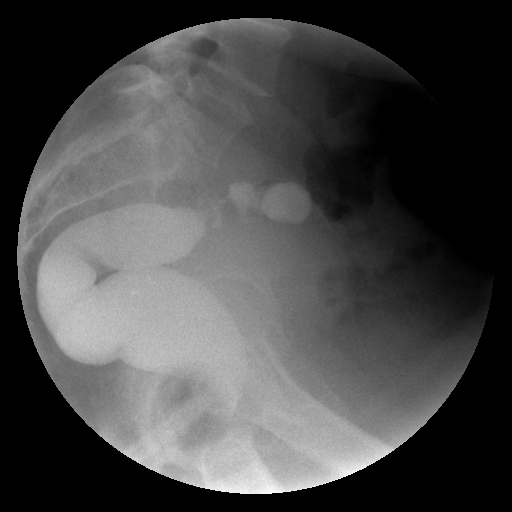

[Series 4: run · 1 of 1 slices shown (3 of 18)]
[im 1/1]
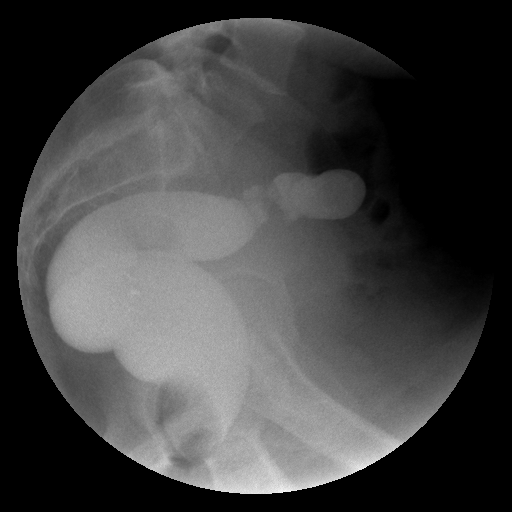

[Series 5: run · 1 of 1 slices shown (4 of 18)]
[im 1/1]
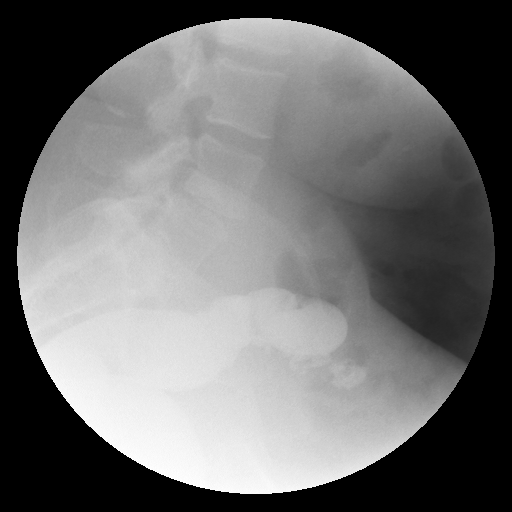

[Series 7: run · 1 of 1 slices shown (5 of 18)]
[im 1/1]
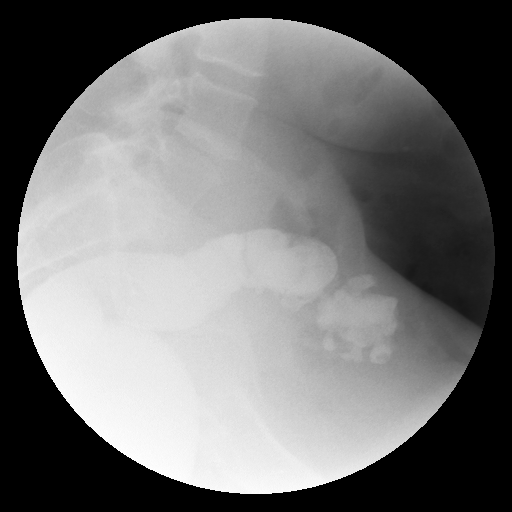

[Series 8: run · 1 of 1 slices shown (6 of 18)]
[im 1/1]
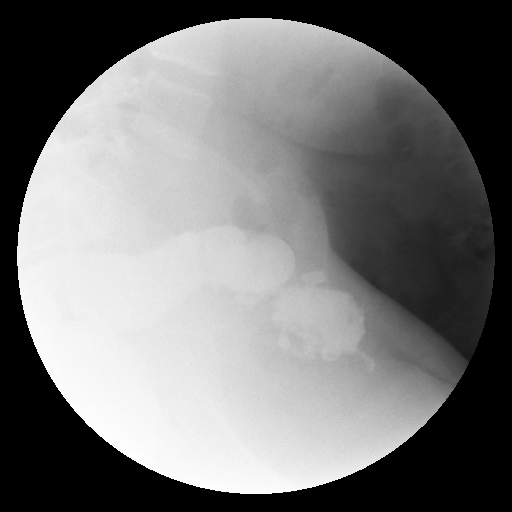

[Series 9: run · 1 of 1 slices shown (7 of 18)]
[im 1/1]
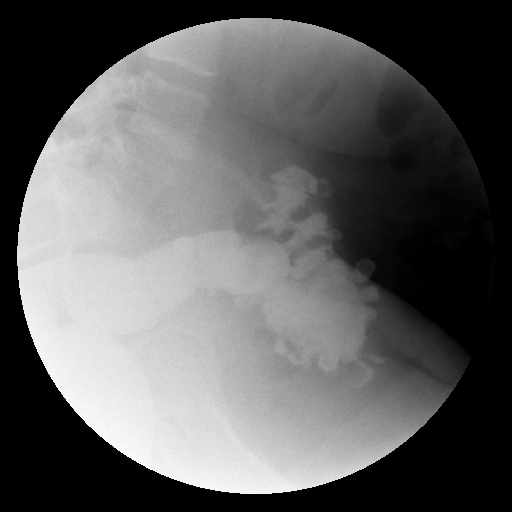

[Series 11: run · 1 of 1 slices shown (8 of 18)]
[im 1/1]
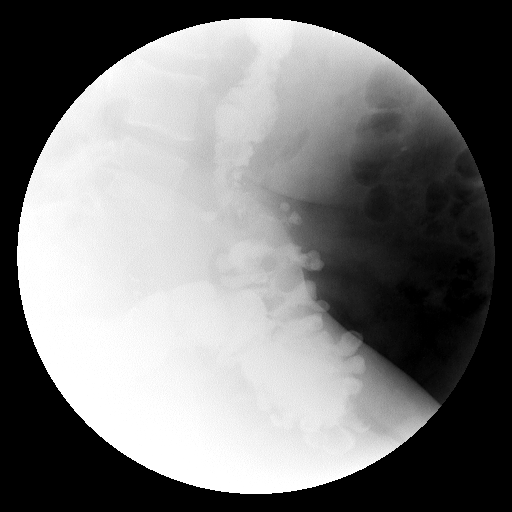

[Series 12: run · 1 of 1 slices shown (9 of 18)]
[im 1/1]
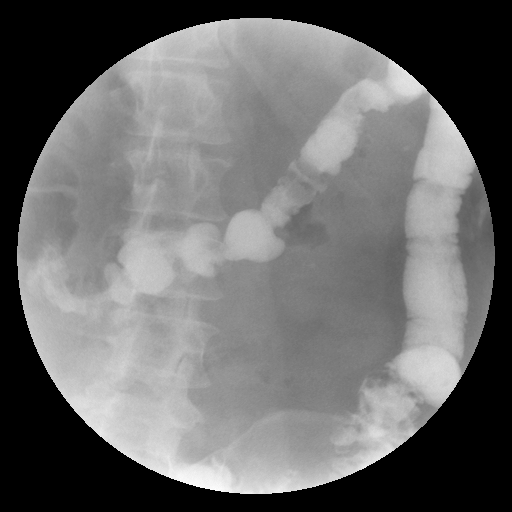

[Series 13: run · 1 of 1 slices shown (10 of 18)]
[im 1/1]
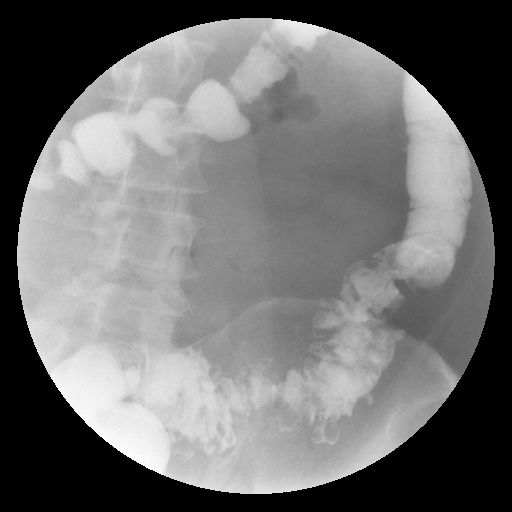

[Series 15: run · 1 of 1 slices shown (11 of 18)]
[im 1/1]
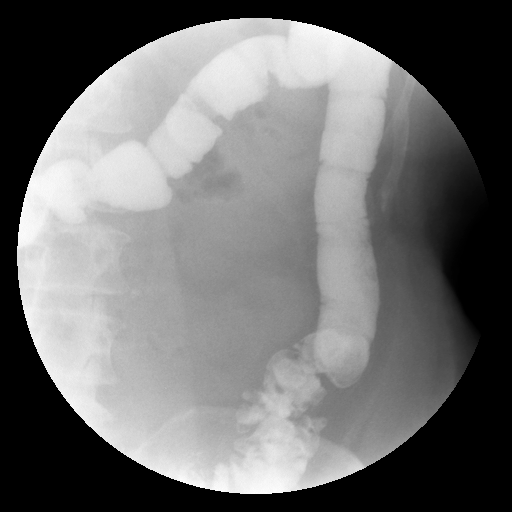

[Series 16: run · 1 of 1 slices shown (12 of 18)]
[im 1/1]
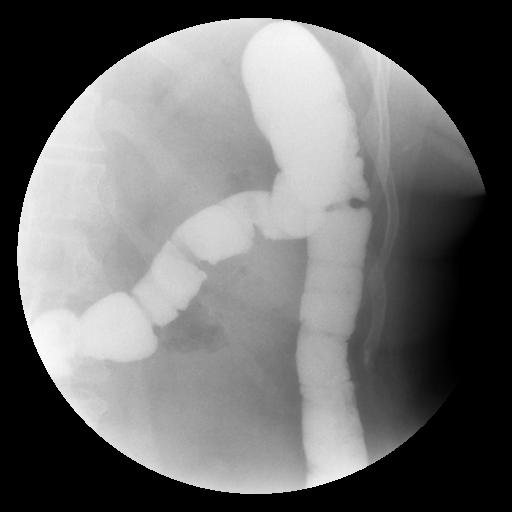

[Series 17: run · 1 of 1 slices shown (13 of 18)]
[im 1/1]
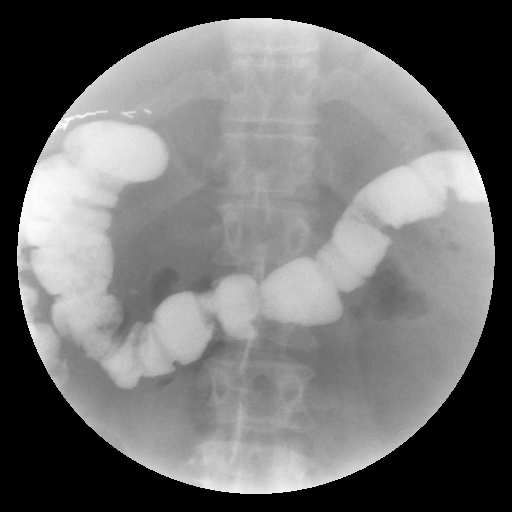

[Series 19: run · 1 of 1 slices shown (14 of 18)]
[im 1/1]
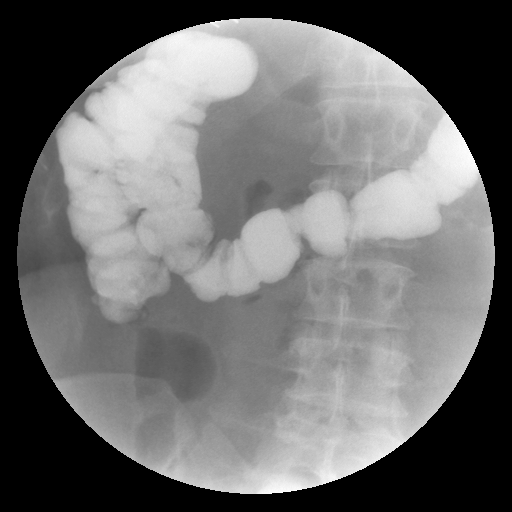

[Series 20: run · 1 of 1 slices shown (15 of 18)]
[im 1/1]
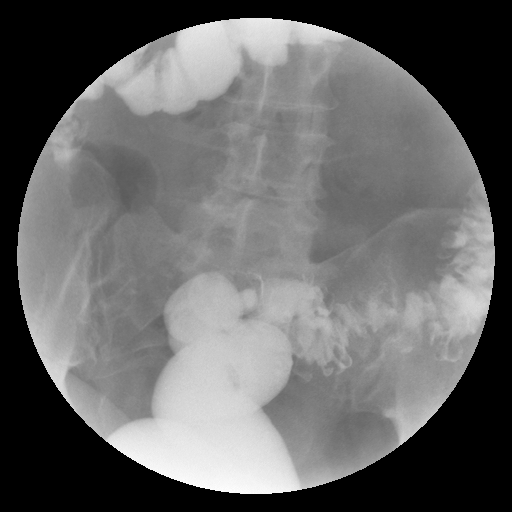

[Series 21: run · 1 of 1 slices shown (16 of 18)]
[im 1/1]
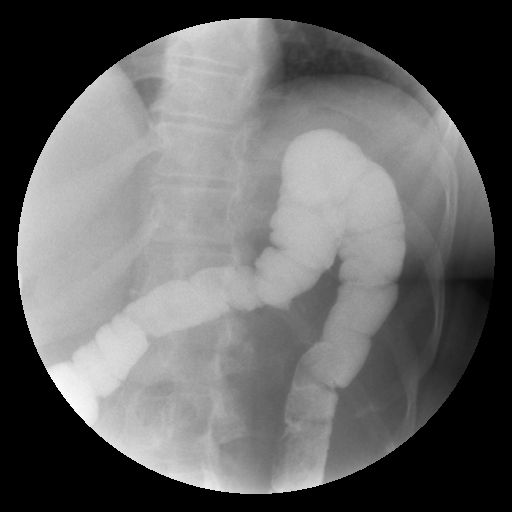

[Series 23: run · 1 of 1 slices shown (17 of 18)]
[im 1/1]
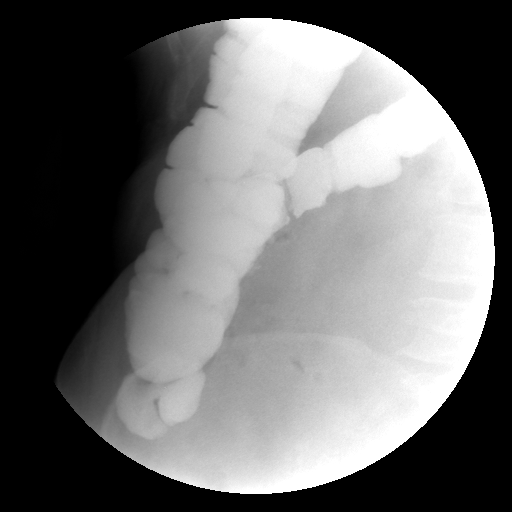

[Series 24: run · 1 of 1 slices shown (18 of 18)]
[im 1/1]
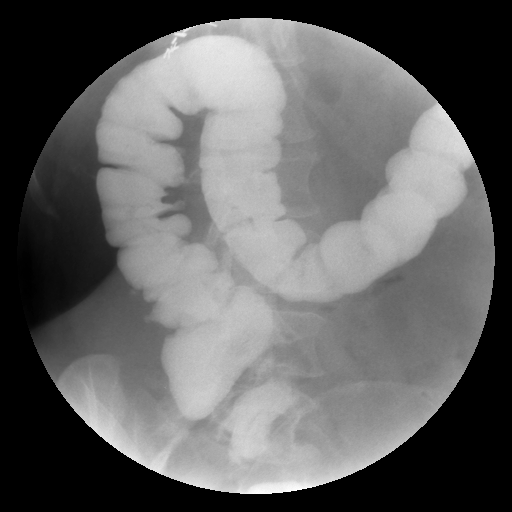

[18 of 24 positions shown; findings below may reference images not displayed]

FINDINGS: Preprocedural study demonstrates negative lung bases,
right upper quadrant surgical clips such as from cholecystectomy,
and a nonobstructed bowel pattern with a paucity of bowel gas.
Mild degenerative changes in the lower lumbar spine.

The patient tolerated procedure well and with only temporary pain
and difficulty as the contrast traversed the sigmoid colon.

Contrast quickly distended the rectum.  Contrast passage through
the sigmoid colon occurred more slowly, and the patient was
symptomatic.  Extensive sigmoid colon diverticulosis and
irregularity is defined.  After few moments, contrast had reached
the descending colon, and subsequently the more proximal transit of
contrast occurred promptly.  Contrast filled the entire right
colon, mildly distended the cecum, and ultimately reflux into the
terminal ileum.

Extensive irregularity, stenosis, and diverticulosis of the sigmoid
colon and distal descending colon along the segment of
approximately 17 cm (series [DATE]). The mid and proximal
descending colon are within normal limits.  Mild redundancy of the
splenic flexure.  The transverse colon is within normal limits.
Mild redundancy of the hepatic flexure.  Occasional diverticula in
the ascending colon (series 25).  The cecum and terminal ileum are
within normal limits.

Postevacuation is demonstrate a small volume of residual barium
contrast throughout the colon.  The ascending colonic diverticula
are well demonstrated on this image.
IMPRESSION: 1.  Extensive irregularity, diverticulosis, and stenosis of the
sigmoid colon and distal descending colon along the segment of
approximately 17 cm.
2.  Mild diverticulosis of the ascending colon.
3.  Other colonic segments are within normal limits.

## 2015-01-18 IMAGING — US US SOFT TISSUE HEAD/NECK
1 series · 14 of 25 positions shown · non-contrast
Comparison: 12/19/2004

CLINICAL DATA: Previous right hemithyroidectomy.

THYROID ULTRASOUND
TECHNIQUE: Ultrasound examination of the thyroid gland and adjacent
soft tissues was performed.

[Series 1: us soft tissue head/neck · 0.08mm/px · 14 of 47 slices shown]
[im 1/47]
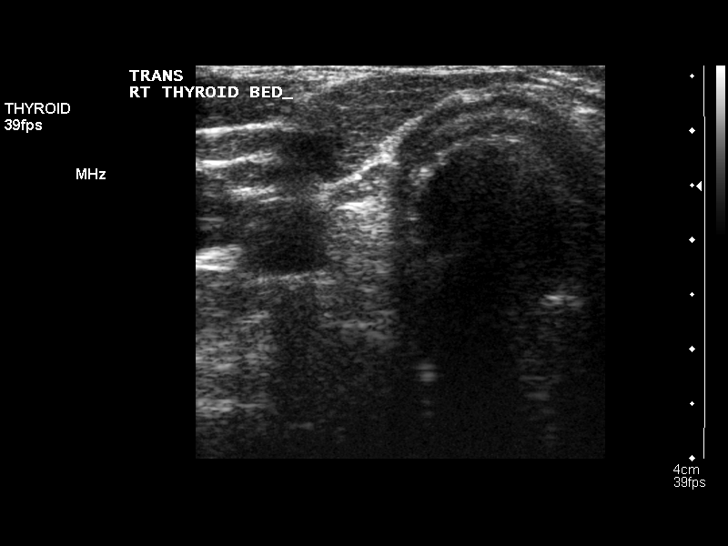
[im 4/47]
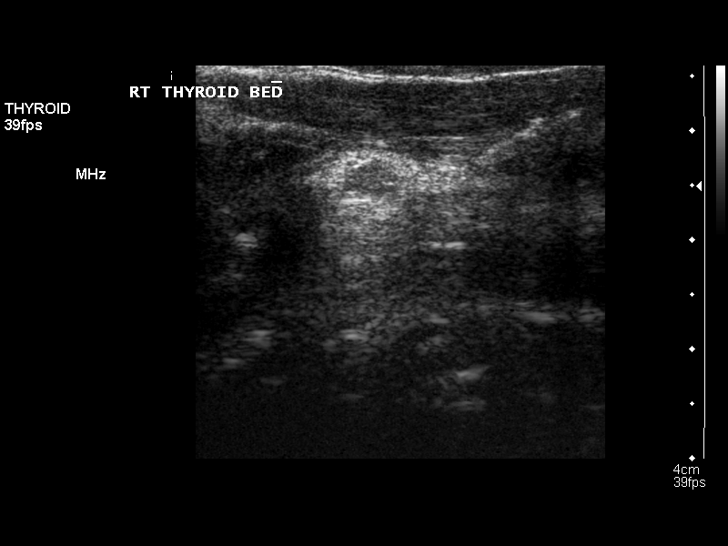
[im 8/47]
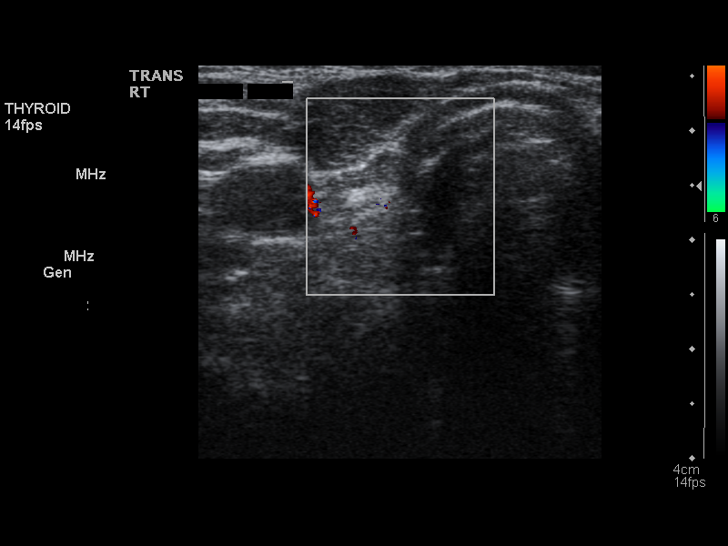
[im 12/47]
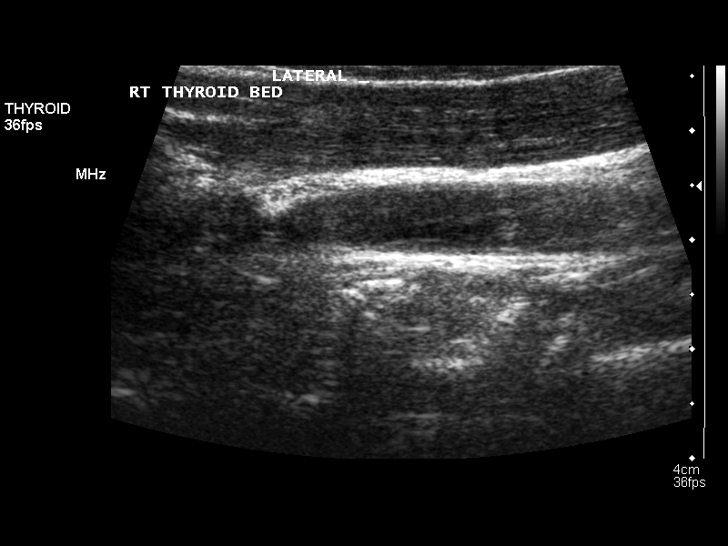
[im 16/47]
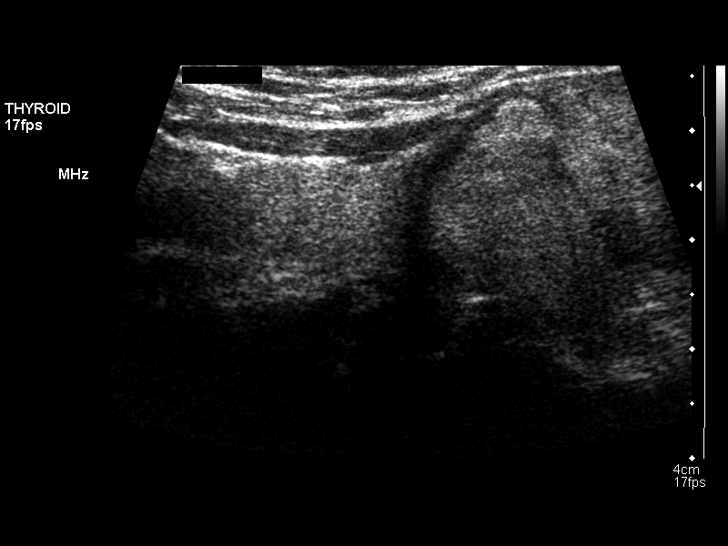
[im 18/47]
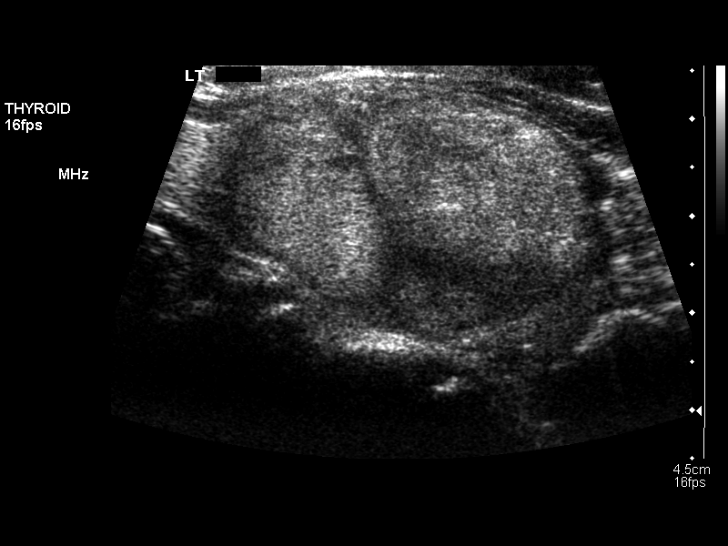
[im 22/47]
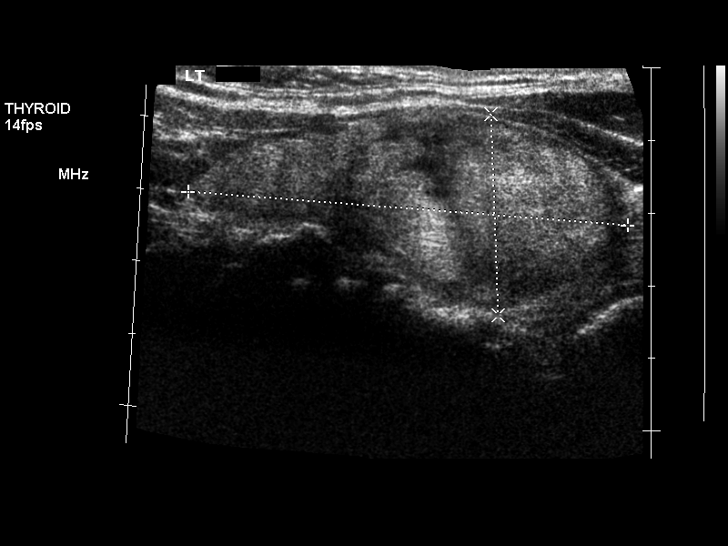
[im 25/47]
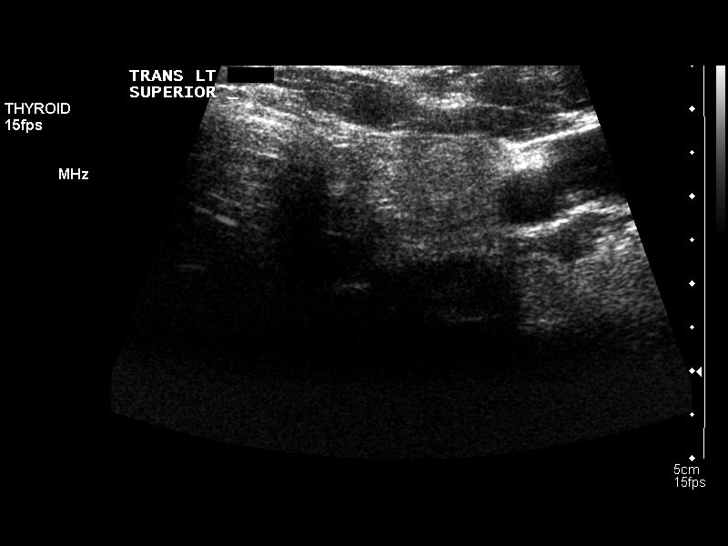
[im 29/47]
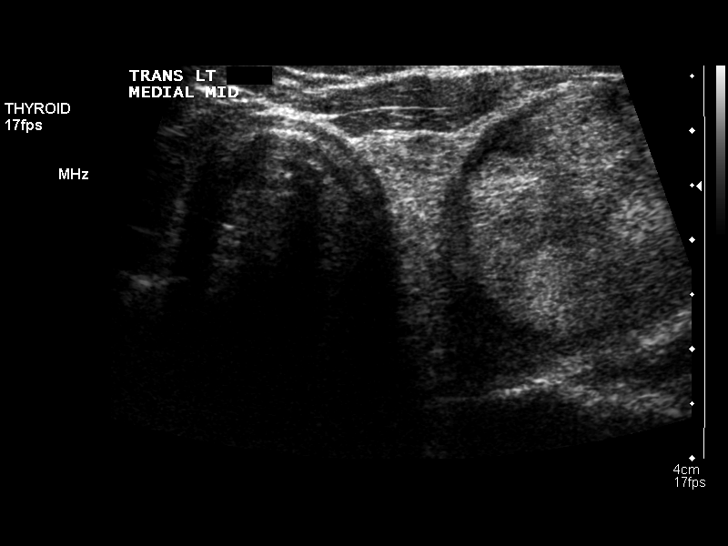
[im 31/47]
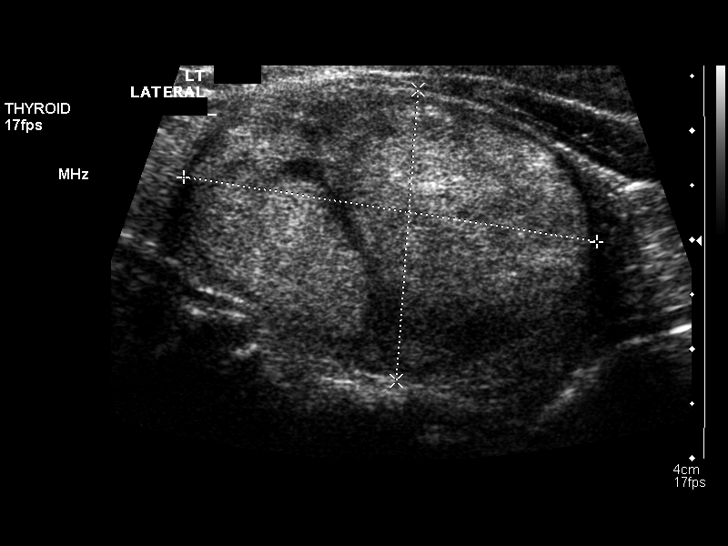
[im 35/47]
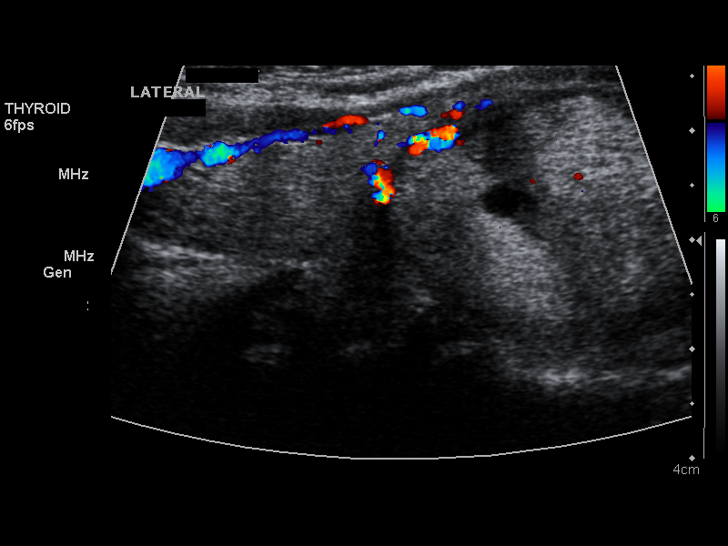
[im 39/47]
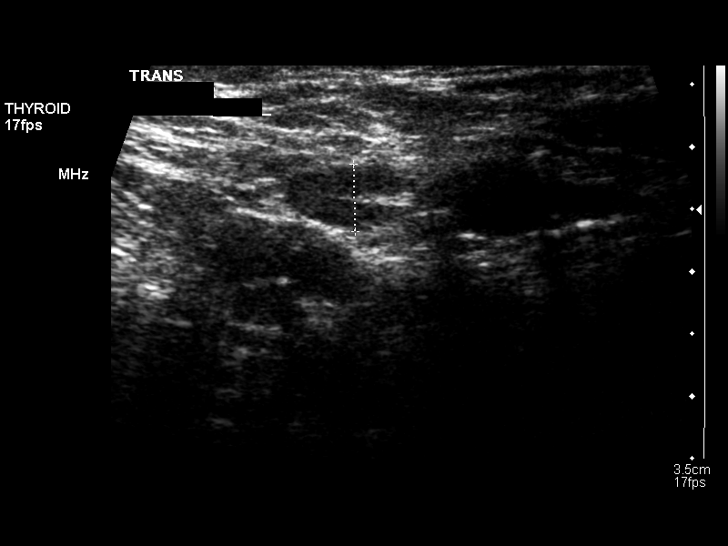
[im 43/47]
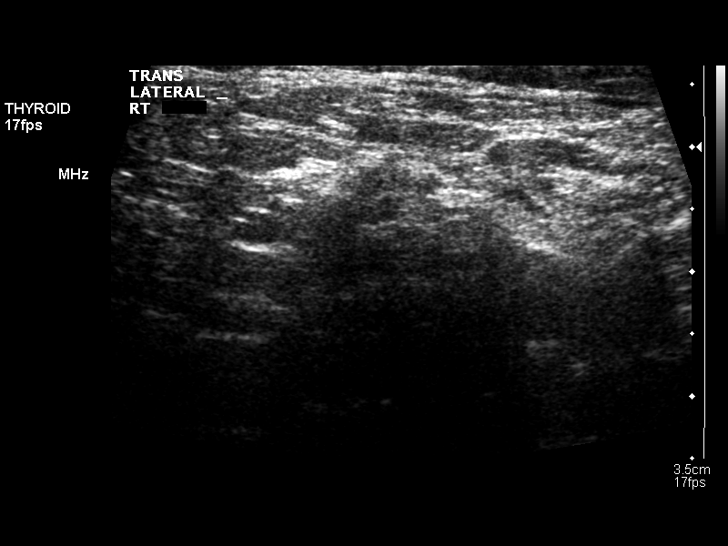
[im 47/47]
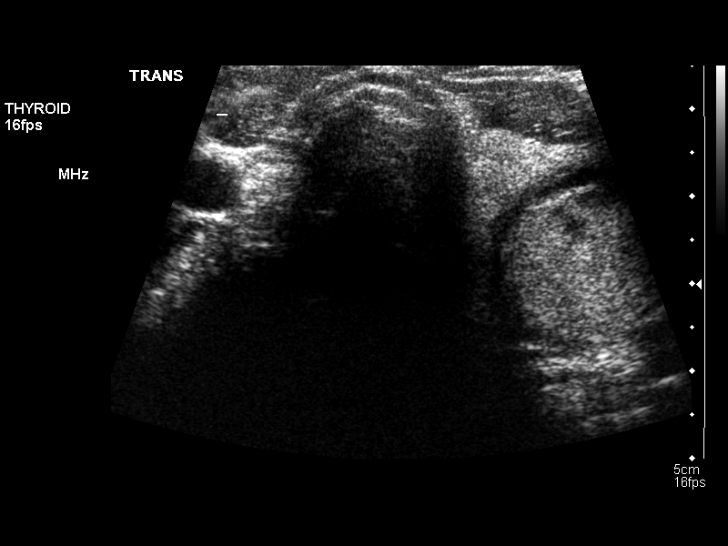

[14 of 25 positions shown; findings below may reference images not displayed]

FINDINGS: Right thyroid lobe:   surgically absent.  There is a 4 x 5 mm
echogenic soft tissue focus in the thyroidectomy bed.}
Left thyroid lobe:  61 x 28 x 32 mm, homogeneous background
echotexture
Isthmus:  1 mm in thickness

Focal nodules:  4 x 6 x 7 mm solid, medial left
27 x 27 x 38 mm complex solid, inferior left

Lymphadenopathy:  None visualized.
IMPRESSION: 1.  New 3.8 cm left thyroid mass. Findings meet consensus criteria
for biopsy.  Ultrasound-guided fine needle aspiration should be
considered, as per the consensus statement: Management of Thyroid
Nodules Detected at US:  Society of Radiologists in Ultrasound
2.  Small echogenic soft tissue focus now evident in the right
thyroidectomy bed, may represent residual or recurrent thyroid
tissue.

## 2015-12-20 ENCOUNTER — Encounter: Payer: Self-pay | Admitting: *Deleted

## 2019-06-05 ENCOUNTER — Encounter: Payer: Self-pay | Admitting: Internal Medicine

## 2019-06-05 ENCOUNTER — Telehealth (INDEPENDENT_AMBULATORY_CARE_PROVIDER_SITE_OTHER): Payer: 59 | Admitting: Internal Medicine

## 2019-06-05 ENCOUNTER — Other Ambulatory Visit: Payer: Self-pay

## 2019-06-05 VITALS — BP 157/95 | HR 83 | Ht 68.0 in | Wt 255.0 lb

## 2019-06-05 DIAGNOSIS — Z6838 Body mass index (BMI) 38.0-38.9, adult: Secondary | ICD-10-CM

## 2019-06-05 DIAGNOSIS — N951 Menopausal and female climacteric states: Secondary | ICD-10-CM

## 2019-06-05 DIAGNOSIS — I10 Essential (primary) hypertension: Secondary | ICD-10-CM | POA: Diagnosis not present

## 2019-06-05 DIAGNOSIS — R519 Headache, unspecified: Secondary | ICD-10-CM

## 2019-06-05 MED ORDER — AMLODIPINE BESYLATE 5 MG PO TABS: 5.0000 mg | ORAL_TABLET | Freq: Every day | ORAL | 3 refills | Status: DC

## 2019-06-05 NOTE — Progress Notes (Signed)
Virtual Visit via Video Note  I connected with@ on 06/05/19 at 10:30 AM EST by a video enabled telemedicine application and verified that I am speaking with the correct person using two identifiers. Location patient: home Location provider:work  office Persons participating in the virtual visit: patient, provider  WIth national recommendations  regarding COVID 19 pandemic   video visit is advised over in office visit for this patient.  Patient aware  of the limitations of evaluation and management by telemedicine and  availability of in person appointments. and agreed to proceed.   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   had though HA were migraines or  reg has as in past but now concerned from HBP . bp at home this wee 161/101 166/104 167/103 and 172/94 Last night took moms pill indapamide 1.25 bp 185/85 to 145/82 then this am 157/85 and 151/100  No sig ha today   She has been off the decongestants for  About 2 weeks   Neg tad   recent dec caffiene  Some weight gain over covid and stress work weight aabout 255 Denies osa sx but ocass snore  Awakens around 3 am since child born 26 years ago  Works e commerce  ROS: See pertinent positives and negatives per HPI. No cp sob  Hx  One leg swells after tummy tuck a few years ago , periods off and on   Last gyne check a while since no insurance  Still take iron some , no vision changes   Past Medical History:  Diagnosis Date  . Anemia    ? from periods on iron a long time  .  Diverticulosis    5-6 years ago  . 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 Swelling of right lower extremity hx of none currently  . Thyroid cancer ([place])   . Thyroid disease    left side enlarged, biopsy done by dr Constance Holster 3 weeks ago, results normal  . Transfusion history 2014   with partial bowel resection    Past Surgical History:  Procedure Laterality Date  . BIOPSY THYROID Left 2014   Medial benign follicular lesion left  . BREAST REDUCTION SURGERY Bilateral 2012  . [place] and 2002  . CHOLECYSTECTOMY  1987  . COSMETIC SURGERY     tummy tuck breast reduction  lipossuction  . PARTIAL COLECTOMY N/A 11/18/2012   Procedure: OPEN SIGMOID COLECTOMY, splenic flexure mobilization, rigid protoscope;  Surgeon: Adin Hector, MD;  Location: [facility];  Service: General;  Laterality: N/A;  . removal of renal lesion benign   2011  . THYROIDECTOMY Right 1999   thyroid cancer  . tummy tuck  2012    Family History  Problem Relation Age of Onset  . Hypertension Mother   . Stroke Maternal Grandmother   .  Breast cancer Maternal Grandmother        great  . Colon cancer Maternal Uncle        great  . Crohn's disease Maternal Grandmother        colitis  . Crohn's disease Maternal Aunt        colitis, diverticulosis  . Protein S deficiency Maternal Aunt     Social History   Tobacco Use  . Smoking status: Former Smoker    Packs/day: 1.00    Years: 9.00    Pack years: 9.00    Types: Cigarettes    Quit date: 03/30/1989    Years since quitting: 30.2  . Smokeless tobacco: Never Used  Substance Use Topics  . Alcohol use: Yes    Comment: rare  . Drug use: No      Current Outpatient Medications:  .  Cholecalciferol (VITAMIN D PO), Take 2,000 Units by mouth daily., Disp: , Rfl:  .  ferrous sulfate 325 (65 FE) MG tablet, TAKE 1 TABLET BY MOUTH WITH BREAKFAST, Disp: 30 tablet, Rfl: 5 .  amLODipine (NORVASC) 5 MG  tablet, Take 1 tablet (5 mg total) by mouth daily., Disp: 30 tablet, Rfl: 3 .  Multiple Vitamin (MULTIVITAMIN WITH MINERALS) TABS tablet, Take 1 tablet by mouth daily., Disp: , Rfl:   EXAM: BP Readings from Last 3 Encounters:  06/05/19 (!) 157/95  01/13/13 140/80  01/02/13 (!) 150/100    VITALS per patient if applicable:see above  In hpi   GENERAL: alert, oriented, appears well and in no acute distress HEENT: atraumatic, conjunttiva clear, no obvious abnormalities on inspection of external nose and ears NECK: normal movements of the head and neck LUNGS: on inspection no signs of respiratory distress, breathing rate appears normal, no obvious gross SOB, gasping or wheezing CV: no obvious cyanosis  PSYCH/NEURO: pleasant and cooperative, no obvious depression or anxiety, speech and thought processing grossly intact Lab Results  Component Value Date   WBC 5.0 01/02/2013   HGB 9.6 (L) 01/02/2013   HCT 30.3 (L) 01/02/2013   PLT 274.0 01/02/2013   GLUCOSE 94 11/23/2012   CHOL 176 08/16/2012   TRIG 36.0 08/16/2012   HDL 67.80 08/16/2012   LDLCALC 101 (H) 08/16/2012   ALT 10 11/10/2012   AST 13 11/10/2012   NA 135 11/23/2012   K 3.7 11/23/2012   CL 102 11/23/2012   CREATININE 0.54 11/23/2012   BUN 3 (L) 11/23/2012   CO2 25 11/23/2012   TSH 1.61 08/16/2012    ASSESSMENT AND PLAN:  Discussed the following assessment and plan:    ICD-10-CM   1. Hypertension, unspecified type uncontrolled   [id number] Basic metabolic panel    CBC with Differential/Platelet    Hemoglobin A1c    Hepatic function panel    Lipid panel    TSH    Microalbumin / creatinine urine ratio    T4, free    Urinalysis, Routine w reflex microscopic  2. Frequent headaches  [id number] Basic metabolic panel    CBC with Differential/Platelet    Hemoglobin A1c    Hepatic function panel    Lipid panel    TSH    Microalbumin / creatinine urine ratio    T4, free    Urinalysis, Routine w reflex microscopic  3.  Perimenopause  [id number] Basic metabolic panel    CBC with Differential/Platelet    Hemoglobin A1c    Hepatic function panel    Lipid panel    TSH    Microalbumin /  creatinine urine ratio    T4, free    Urinalysis, Routine w reflex microscopic  4. BMI 38.0-38.9,adult  Z68.38    Reestablishing under acute problems   I saw her today for acute  Work in but will need to establish with other providers ( not taking new patients) but will order labs and fu visit end of March to help begin BP HA work up.  Will probably need   2 meds for control  Perhaps adding diuretic to the mix also   Disc monitoring ha and meds and to avoid rebound Get a gyne appt   Counseled.   Expectant management and discussion of plan and treatment with opportunity to ask questions and all were answered. The patient agreed with the plan and demonstrated an understanding of the instructions.   Advised to call back or seek an in-person evaluation if worsening  or having  further concerns . In interim  Return for in person visit w me last week March , lab pre visit  [facility]  in 2-3 weeks  new provider later.    Shanon Ace, MD

## 2019-06-20 ENCOUNTER — Other Ambulatory Visit (INDEPENDENT_AMBULATORY_CARE_PROVIDER_SITE_OTHER): Payer: 59

## 2019-06-20 DIAGNOSIS — R519 Headache, unspecified: Secondary | ICD-10-CM | POA: Diagnosis not present

## 2019-06-20 DIAGNOSIS — N951 Menopausal and female climacteric states: Secondary | ICD-10-CM | POA: Diagnosis not present

## 2019-06-20 DIAGNOSIS — I10 Essential (primary) hypertension: Secondary | ICD-10-CM | POA: Diagnosis not present

## 2019-06-20 LAB — URINALYSIS, ROUTINE W REFLEX MICROSCOPIC
Bilirubin Urine: NEGATIVE
Ketones, ur: NEGATIVE
Leukocytes,Ua: NEGATIVE
Nitrite: NEGATIVE
Specific Gravity, Urine: >=1.03 — AB (ref 1.000–1.030)
Total Protein, Urine: NEGATIVE
Urine Glucose: NEGATIVE
Urobilinogen, UA: 0.2 (ref 0.0–1.0)
pH: 5 (ref 5.0–8.0)

## 2019-06-20 LAB — BASIC METABOLIC PANEL
BUN: 12 mg/dL (ref 6–23)
CO2: 27 mEq/L (ref 19–32)
Calcium: 9.6 mg/dL (ref 8.4–10.5)
Chloride: 102 mEq/L (ref 96–112)
Creatinine, Ser: 0.72 mg/dL (ref 0.40–1.20)
GFR: 101.61 mL/min (ref >60.00)
Glucose, Bld: 95 mg/dL (ref 70–99)
Potassium: 4.1 mEq/L (ref 3.5–5.1)
Sodium: 138 mEq/L (ref 135–145)

## 2019-06-20 LAB — CBC WITH DIFFERENTIAL/PLATELET
Basophils Absolute: 0 10*3/uL (ref 0.0–0.1)
Basophils Relative: 0.6 % (ref 0.0–3.0)
Eosinophils Absolute: 0.1 10*3/uL (ref 0.0–0.7)
Eosinophils Relative: 1.1 % (ref 0.0–5.0)
HCT: 40.8 % (ref 36.0–46.0)
Hemoglobin: 13.5 g/dL (ref 12.0–15.0)
Lymphocytes Relative: 14.7 % (ref 12.0–46.0)
Lymphs Abs: 0.9 10*3/uL (ref 0.7–4.0)
MCHC: 33 g/dL (ref 30.0–36.0)
MCV: 82 fl (ref 78.0–100.0)
Monocytes Absolute: 0.5 10*3/uL (ref 0.1–1.0)
Monocytes Relative: 7.8 % (ref 3.0–12.0)
Neutro Abs: 4.5 10*3/uL (ref 1.4–7.7)
Neutrophils Relative %: 75.8 % (ref 43.0–77.0)
Platelets: 253 10*3/uL (ref 150.0–400.0)
RBC: 4.98 Mil/uL (ref 3.87–5.11)
RDW: 19.1 % — ABNORMAL HIGH (ref 11.5–15.5)
WBC: 5.9 10*3/uL (ref 4.0–10.5)

## 2019-06-20 LAB — LIPID PANEL
Cholesterol: 221 mg/dL — ABNORMAL HIGH (ref 0–200)
HDL: 52.2 mg/dL (ref >39.00)
LDL Cholesterol: 155 mg/dL — ABNORMAL HIGH (ref 0–99)
NonHDL: 168.31
Total CHOL/HDL Ratio: 4
Triglycerides: 67 mg/dL (ref 0.0–149.0)
VLDL: 13.4 mg/dL (ref 0.0–40.0)

## 2019-06-20 LAB — T4, FREE: Free T4: 0.68 ng/dL (ref 0.60–1.60)

## 2019-06-20 LAB — HEPATIC FUNCTION PANEL
ALT: 21 U/L (ref 0–35)
AST: 19 U/L (ref 0–37)
Albumin: 4.3 g/dL (ref 3.5–5.2)
Alkaline Phosphatase: 73 U/L (ref 39–117)
Bilirubin, Direct: 0 mg/dL (ref 0.0–0.3)
Total Bilirubin: 0.4 mg/dL (ref 0.2–1.2)
Total Protein: 7.6 g/dL (ref 6.0–8.3)

## 2019-06-20 LAB — MICROALBUMIN / CREATININE URINE RATIO
Creatinine,U: 245.2 mg/dL
Microalb Creat Ratio: 1.5 mg/g (ref 0.0–30.0)
Microalb, Ur: 3.6 mg/dL — ABNORMAL HIGH (ref 0.0–1.9)

## 2019-06-20 LAB — HEMOGLOBIN A1C: Hgb A1c MFr Bld: 5.7 % (ref 4.6–6.5)

## 2019-06-20 LAB — TSH: TSH: 2.17 u[IU]/mL (ref 0.35–4.50)

## 2019-06-25 NOTE — Progress Notes (Signed)
This visit occurred during the SARS-CoV-2 public health emergency.  Safety protocols were in place, including screening questions prior to the visit, additional usage of staff PPE, and extensive cleaning of exam room while observing appropriate contact time as indicated for disinfecting solutions.    Chief Complaint  Patient presents with  . Hypertension  . Headache  . Medication Management    HPI: Misty Moran 54 y.o. come in for   FU of acute visit for uncontrolled ht and headaches  Until she can establish with new  Provider   Has  Getting better  Less intense  But  weaning te bcs but mom gave her a alprazolam when had a ha with stress   Lives with mom who eats unhealthy son  Flunking last semester but to go to college  [employer]   Usually on left with eye pressure pain   And  Usually left side  Sinus area   Around last 6 mos  No nuero sx      GM.   Had brain aneuysm.  In 40 s.  And concerna bout  Heredity   No dip;o[ia focal weakness   BP readings coming down and was 127/70 this am  Has been 149/88 and 142/88 some differnce in sides ? Has wrist and arm machine  Stress at home work son and mom living style    Advice last note  Reestablishing under acute problems   I saw her today for acute  Work in but will need to establish with other providers ( not taking new patients) but will order labs and fu visit end of March to help begin BP HA work up.  Will probably need   2 meds for control  Perhaps adding diuretic to the mix also   Disc monitoring ha and meds and to avoid rebound Get a gyne appt   Counseled.  Expectant management and discussion of plan and treatment with opportunity to ask questions and all were answered. The patient agreed with the plan and demonstrated an understanding of the instructions.  Advised to call back or seek an in-person evaluation if worsening  or having  further concerns . In interim  Return for in person visit w me last week March , lab pre  visit  [facility]  in 2-3 weeks  new provider later.   ROS: See pertinent positives and negatives per HPI.  herni bulgin right latera at times and pops it back in   Past Medical History:  Diagnosis Date  . Anemia    ? from periods on iron a long time  . Diverticulosis    5-6 years ago  . 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 Swelling of right lower extremity hx of none currently  . Thyroid cancer ([place])   . Thyroid disease    left side enlarged, biopsy done by dr Constance Holster 3 weeks ago, results normal  . Transfusion history 2014   with partial bowel resection    Family History  Problem Relation Age of Onset  . Hypertension Mother   . Stroke Maternal Grandmother   . Breast cancer Maternal Grandmother        great  . Colon cancer Maternal Uncle        great  . Crohn's disease Maternal Grandmother        colitis  . Crohn's disease Maternal Aunt  colitis, diverticulosis  . Protein S deficiency Maternal Aunt     Social History   Socioeconomic History  . Marital status: Divorced    Spouse name: Not on file  . Number of children: 2  . Years of education: Not on file  . Highest education level: Not on file  Occupational History  . Occupation: self employed  Tobacco Use  . Smoking status: Former Smoker    Packs/day: 1.00    Years: 9.00    Pack years: 9.00    Types: Cigarettes    Quit date: 03/30/1989    Years since quitting: 30.2  . Smokeless tobacco: Never Used  Substance and Sexual Activity  . Alcohol use: Yes    Comment: rare  . Drug use: No  . Sexual activity: Not on file  Other Topics Concern  . 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          Social Determinants of Health   Financial Resource Strain:   . Difficulty of Paying Living Expenses:   Food Insecurity:    . Worried About Charity fundraiser in the Last Year:   . Arboriculturist in the Last Year:   Transportation Needs:   . Film/video editor (Medical):   Marland Kitchen Lack of Transportation (Non-Medical):   Physical Activity:   . Days of Exercise per Week:   . Minutes of Exercise per Session:   Stress:   . Feeling of Stress :   Social Connections:   . Frequency of Communication with Friends and Family:   . Frequency of Social Gatherings with Friends and Family:   . Attends Religious Services:   . Active Member of Clubs or Organizations:   . Attends Archivist Meetings:   Marland Kitchen Marital Status:     Outpatient Medications Prior to Visit  Medication Sig Dispense Refill  . amLODipine (NORVASC) 5 MG tablet Take 1 tablet (5 mg total) by mouth daily. 30 tablet 3  . Cholecalciferol (VITAMIN D PO) Take 2,000 Units by mouth daily.    . ferrous sulfate 325 (65 FE) MG tablet TAKE 1 TABLET BY MOUTH WITH BREAKFAST 30 tablet 5  . magnesium 30 MG tablet Take 30 mg by mouth 2 (two) times daily.    . potassium chloride (KLOR-CON) 10 MEQ tablet Take 10 mEq by mouth daily.    . Multiple Vitamin (MULTIVITAMIN WITH MINERALS) TABS tablet Take 1 tablet by mouth daily.     No facility-administered medications prior to visit.     EXAM:  BP 120/80   Pulse 82   Temp 98.1 F (36.7 C) (Other (Comment))   Ht 5\' 8"  (1.727 m)   Wt 258 lb (117 kg)   SpO2 99%   BMI 39.23 kg/m   Body mass index is 39.23 kg/m. bp r large 126/80 left 134/88  GENERAL: vitals reviewed and listed above, alert, oriented, appears well hydrated and in no acute distress HEENT: atraumatic, conjunctiva  clear, no obvious abnormalities on inspection of external nose and ears OP :masked  NECK: no obvious masses on inspection palpation  No adenopathy  LUNGS: clear to auscultation bilaterally, no wheezes, rales or rhonchi, good air movement CV: HRRR, no clubbing cyanosis or  peripheral edema nl  cap refill  abd no masses surg scar   Right para bventral hernia  No pain redness  MS: moves all extremities without noticeable focal  abnormality PSYCH: pleasant and cooperative, no obvious depression or anxiety Lab Results  Component Value Date   WBC 5.9 06/20/2019   HGB 13.5 06/20/2019   HCT 40.8 06/20/2019   PLT 253.0 06/20/2019   GLUCOSE 95 06/20/2019   CHOL 221 (H) 06/20/2019   TRIG 67.0 06/20/2019   HDL 52.20 06/20/2019   LDLCALC 155 (H) 06/20/2019   ALT 21 06/20/2019   AST 19 06/20/2019   NA 138 06/20/2019   K 4.1 06/20/2019   CL 102 06/20/2019   CREATININE 0.72 06/20/2019   BUN 12 06/20/2019   CO2 27 06/20/2019   TSH 2.17 06/20/2019   HGBA1C 5.7 06/20/2019   MICROALBUR 3.6 (H) 06/20/2019   BP Readings from Last 3 Encounters:  06/26/19 120/80  06/05/19 (!) 157/95  01/13/13 140/80  pfizer  Vaccine#1    3 27 21   ASSESSMENT AND PLAN:  Discussed the following assessment and plan:  Hypertension, unspecified type better control   Frequent headaches - mitigated but persist   Left-sided headache  Family history of cerebral aneurysm  Class 2 severe obesity with serious comorbidity and body mass index (BMI) of 39.0 to 39.9 in adult, unspecified obesity type ([place])  Abdominal wall hernia Lab in range except  Lipids could be better   Did not address the UA concentrated ++epi and some rbcs   Is very hesitant to see another provider at this time will continue for now .  Although headaches better still  Atypical unilateral  And fam hx aneurysm concern  At this time  lsi and ht control  Disc diet  Changes and sodium  Content  Plan referral to neurology for left sided headaches  Exam seem non focal  Continue bp control and rov  In 3 mos or about   -Patient advised to return or notify health care team  if  new concerns arise.  Patient Instructions   Health Maintenance Due  Topic Date Due  . Hepatitis C Screening  Never done  . HIV Screening  Never done  . MAMMOGRAM  01/28/2014  . PAP SMEAR-Modifier   09/08/2015  . INFLUENZA VACCINE  Never done   Your BP is better today and exam is reassuring  126/80 134/88  Today  Continue same medication and healthy  Weight loss lifestyle. Consideration of more evaluation headaches because of family hx  Will do neuro referral.    DASH Eating Plan DASH stands for "Dietary Approaches to Stop Hypertension." The DASH eating plan is a healthy eating plan that has been shown to reduce high blood pressure (hypertension). It may also reduce your risk for type 2 diabetes, heart disease, and stroke. The DASH eating plan may also help with weight loss. What are tips for following this plan?  General guidelines  Avoid eating more than 2,300 mg (milligrams) of salt (sodium) a day. If you have hypertension, you may need to reduce your sodium intake to 1,500 mg a day.  Limit alcohol intake to no more than 1 drink a day for nonpregnant women and 2 drinks a day for men. One drink equals 12 oz of beer, 5 oz of wine, or 1 oz of hard liquor.  Work with your health care provider to maintain a healthy body weight or to lose weight. Ask what an ideal weight is for you.  Get at least  30 minutes of exercise that causes your heart to beat faster (aerobic exercise) most days of the week. Activities may include walking, swimming, or biking.  Work with your health care provider or diet and nutrition specialist (dietitian) to adjust your eating plan to your individual calorie needs. Reading food labels   Check food labels for the amount of sodium per serving. Choose foods with less than 5 percent of the Daily Value of sodium. Generally, foods with less than 300 mg of sodium per serving fit into this eating plan.  To find whole grains, look for the word "whole" as the first word in the ingredient list. Shopping  Buy products labeled as "low-sodium" or "no salt added."  Buy fresh foods. Avoid canned foods and premade or frozen meals. Cooking  Avoid adding salt when  cooking. Use salt-free seasonings or herbs instead of table salt or sea salt. Check with your health care provider or pharmacist before using salt substitutes.  Do not fry foods. Cook foods using healthy methods such as baking, boiling, grilling, and broiling instead.  Cook with heart-healthy oils, such as olive, canola, soybean, or sunflower oil. Meal planning  Eat a balanced diet that includes: ? 5 or more servings of fruits and vegetables each day. At each meal, try to fill half of your plate with fruits and vegetables. ? Up to 6-8 servings of whole grains each day. ? Less than 6 oz of lean meat, poultry, or fish each day. A 3-oz serving of meat is about the same size as a deck of cards. One egg equals 1 oz. ? 2 servings of low-fat dairy each day. ? A serving of nuts, seeds, or beans 5 times each week. ? Heart-healthy fats. Healthy fats called Omega-3 fatty acids are found in foods such as flaxseeds and coldwater fish, like sardines, salmon, and mackerel.  Limit how much you eat of the following: ? Canned or prepackaged foods. ? Food that is high in trans fat, such as fried foods. ? Food that is high in saturated fat, such as fatty meat. ? Sweets, desserts, sugary drinks, and other foods with added sugar. ? Full-fat dairy products.  Do not salt foods before eating.  Try to eat at least 2 vegetarian meals each week.  Eat more home-cooked food and less restaurant, buffet, and fast food.  When eating at a restaurant, ask that your food be prepared with less salt or no salt, if possible. What foods are recommended? The items listed may not be a complete list. Talk with your dietitian about what dietary choices are best for you. Grains Whole-grain or whole-wheat bread. Whole-grain or whole-wheat pasta. Brown rice. [place]. Bulgur. Whole-grain and low-sodium cereals. Pita bread. Low-fat, low-sodium crackers. Whole-wheat flour tortillas. Vegetables Fresh or frozen vegetables  (raw, steamed, roasted, or grilled). Low-sodium or reduced-sodium tomato and vegetable juice. Low-sodium or reduced-sodium tomato sauce and tomato paste. Low-sodium or reduced-sodium canned vegetables. Fruits All fresh, dried, or frozen fruit. Canned fruit in natural juice (without added sugar). Meat and other protein foods Skinless chicken or [place]. Ground chicken or [place]. Pork with fat trimmed off. Fish and seafood. Egg whites. Dried beans, peas, or lentils. Unsalted nuts, nut butters, and seeds. Unsalted canned beans. Lean cuts of beef with fat trimmed off. Low-sodium, lean deli meat. Dairy Low-fat (1%) or fat-free (skim) milk. Fat-free, low-fat, or reduced-fat cheeses. Nonfat, low-sodium ricotta or cottage cheese. Low-fat or nonfat yogurt. Low-fat, low-sodium cheese. Fats and oils Soft margarine without trans fats. Vegetable  oil. Low-fat, reduced-fat, or light mayonnaise and salad dressings (reduced-sodium). Canola, safflower, olive, soybean, and sunflower oils. Avocado. Seasoning and other foods Herbs. Spices. Seasoning mixes without salt. Unsalted popcorn and pretzels. Fat-free sweets. What foods are not recommended? The items listed may not be a complete list. Talk with your dietitian about what dietary choices are best for you. Grains Baked goods made with fat, such as croissants, muffins, or some breads. Dry pasta or rice meal packs. Vegetables Creamed or fried vegetables. Vegetables in a cheese sauce. Regular canned vegetables (not low-sodium or reduced-sodium). Regular canned tomato sauce and paste (not low-sodium or reduced-sodium). Regular tomato and vegetable juice (not low-sodium or reduced-sodium). Angie Fava. Olives. Fruits Canned fruit in a light or heavy syrup. Fried fruit. Fruit in cream or butter sauce. Meat and other protein foods Fatty cuts of meat. Ribs. Fried meat. Berniece Salines. Sausage. Bologna and other processed lunch meats. Salami. Fatback. Hotdogs. Bratwurst. Salted nuts  and seeds. Canned beans with added salt. Canned or smoked fish. Whole eggs or egg yolks. Chicken or [place] with skin. Dairy Whole or 2% milk, cream, and half-and-half. Whole or full-fat cream cheese. Whole-fat or sweetened yogurt. Full-fat cheese. Nondairy creamers. Whipped toppings. Processed cheese and cheese spreads. Fats and oils Butter. Stick margarine. Lard. Shortening. Ghee. Bacon fat. Tropical oils, such as coconut, palm kernel, or palm oil. Seasoning and other foods Salted popcorn and pretzels. Onion salt, garlic salt, seasoned salt, table salt, and sea salt. Worcestershire sauce. Tartar sauce. Barbecue sauce. Teriyaki sauce. Soy sauce, including reduced-sodium. Steak sauce. Canned and packaged gravies. Fish sauce. Oyster sauce. Cocktail sauce. Horseradish that you find on the shelf. Ketchup. Mustard. Meat flavorings and tenderizers. Bouillon cubes. Hot sauce and Tabasco sauce. Premade or packaged marinades. Premade or packaged taco seasonings. Relishes. Regular salad dressings. Where to find more information:  National Heart, Lung, and [place]: https://wilson-eaton.com/  American Heart Association: www.heart.org Summary  The DASH eating plan is a healthy eating plan that has been shown to reduce high blood pressure (hypertension). It may also reduce your risk for type 2 diabetes, heart disease, and stroke.  With the DASH eating plan, you should limit salt (sodium) intake to 2,300 mg a day. If you have hypertension, you may need to reduce your sodium intake to 1,500 mg a day.  When on the DASH eating plan, aim to eat more fresh fruits and vegetables, whole grains, lean proteins, low-fat dairy, and heart-healthy fats.  Work with your health care provider or diet and nutrition specialist (dietitian) to adjust your eating plan to your individual calorie needs. This information is not intended to replace advice given to you by your health care provider. Make sure you discuss any questions  you have with your health care provider. Document Revised: 02/26/2017 Document Reviewed: 03/09/2016 Elsevier Patient Education  2020 [place] Chrisandra Wiemers M.D.

## 2019-06-26 ENCOUNTER — Ambulatory Visit (INDEPENDENT_AMBULATORY_CARE_PROVIDER_SITE_OTHER): Payer: 59 | Admitting: Internal Medicine

## 2019-06-26 ENCOUNTER — Encounter: Payer: Self-pay | Admitting: Internal Medicine

## 2019-06-26 VITALS — BP 120/80 | HR 82 | Temp 98.1°F | Ht 68.0 in | Wt 258.0 lb

## 2019-06-26 DIAGNOSIS — I10 Essential (primary) hypertension: Secondary | ICD-10-CM | POA: Diagnosis not present

## 2019-06-26 DIAGNOSIS — R519 Headache, unspecified: Secondary | ICD-10-CM | POA: Diagnosis not present

## 2019-06-26 DIAGNOSIS — Z8249 Family history of ischemic heart disease and other diseases of the circulatory system: Secondary | ICD-10-CM | POA: Diagnosis not present

## 2019-06-26 DIAGNOSIS — Z6839 Body mass index (BMI) 39.0-39.9, adult: Secondary | ICD-10-CM

## 2019-06-26 DIAGNOSIS — K439 Ventral hernia without obstruction or gangrene: Secondary | ICD-10-CM

## 2019-06-26 NOTE — Patient Instructions (Addendum)
Health Maintenance Due  Topic Date Due  . Hepatitis C Screening  Never done  . HIV Screening  Never done  . MAMMOGRAM  01/28/2014  . PAP SMEAR-Modifier  09/08/2015  . INFLUENZA VACCINE  Never done   Your BP is better today and exam is reassuring  126/80 134/88  Today  Continue same medication and healthy  Weight loss lifestyle. Consideration of more evaluation headaches because of family hx  Will do neuro referral.    DASH Eating Plan DASH stands for "Dietary Approaches to Stop Hypertension." The DASH eating plan is a healthy eating plan that has been shown to reduce high blood pressure (hypertension). It may also reduce your risk for type 2 diabetes, heart disease, and stroke. The DASH eating plan may also help with weight loss. What are tips for following this plan?  General guidelines  Avoid eating more than 2,300 mg (milligrams) of salt (sodium) a day. If you have hypertension, you may need to reduce your sodium intake to 1,500 mg a day.  Limit alcohol intake to no more than 1 drink a day for nonpregnant women and 2 drinks a day for men. One drink equals 12 oz of beer, 5 oz of wine, or 1 oz of hard liquor.  Work with your health care provider to maintain a healthy body weight or to lose weight. Ask what an ideal weight is for you.  Get at least 30 minutes of exercise that causes your heart to beat faster (aerobic exercise) most days of the week. Activities may include walking, swimming, or biking.  Work with your health care provider or diet and nutrition specialist (dietitian) to adjust your eating plan to your individual calorie needs. Reading food labels   Check food labels for the amount of sodium per serving. Choose foods with less than 5 percent of the Daily Value of sodium. Generally, foods with less than 300 mg of sodium per serving fit into this eating plan.  To find whole grains, look for the word "whole" as the first word in the ingredient list. Shopping  Buy  products labeled as "low-sodium" or "no salt added."  Buy fresh foods. Avoid canned foods and premade or frozen meals. Cooking  Avoid adding salt when cooking. Use salt-free seasonings or herbs instead of table salt or sea salt. Check with your health care provider or pharmacist before using salt substitutes.  Do not fry foods. Cook foods using healthy methods such as baking, boiling, grilling, and broiling instead.  Cook with heart-healthy oils, such as olive, canola, soybean, or sunflower oil. Meal planning  Eat a balanced diet that includes: ? 5 or more servings of fruits and vegetables each day. At each meal, try to fill half of your plate with fruits and vegetables. ? Up to 6-8 servings of whole grains each day. ? Less than 6 oz of lean meat, poultry, or fish each day. A 3-oz serving of meat is about the same size as a deck of cards. One egg equals 1 oz. ? 2 servings of low-fat dairy each day. ? A serving of nuts, seeds, or beans 5 times each week. ? Heart-healthy fats. Healthy fats called Omega-3 fatty acids are found in foods such as flaxseeds and coldwater fish, like sardines, salmon, and mackerel.  Limit how much you eat of the following: ? Canned or prepackaged foods. ? Food that is high in trans fat, such as fried foods. ? Food that is high in saturated fat, such as fatty meat. ?  Sweets, desserts, sugary drinks, and other foods with added sugar. ? Full-fat dairy products.  Do not salt foods before eating.  Try to eat at least 2 vegetarian meals each week.  Eat more home-cooked food and less restaurant, buffet, and fast food.  When eating at a restaurant, ask that your food be prepared with less salt or no salt, if possible. What foods are recommended? The items listed may not be a complete list. Talk with your dietitian about what dietary choices are best for you. Grains Whole-grain or whole-wheat bread. Whole-grain or whole-wheat pasta. Brown rice. [place]row.  Bulgur. Whole-grain and low-sodium cereals. Pita bread. Low-fat, low-sodium crackers. Whole-wheat flour tortillas. Vegetables Fresh or frozen vegetables (raw, steamed, roasted, or grilled). Low-sodium or reduced-sodium tomato and vegetable juice. Low-sodium or reduced-sodium tomato sauce and tomato paste. Low-sodium or reduced-sodium canned vegetables. Fruits All fresh, dried, or frozen fruit. Canned fruit in natural juice (without added sugar). Meat and other protein foods Skinless chicken or [place]. Ground chicken or [place]. Pork with fat trimmed off. Fish and seafood. Egg whites. Dried beans, peas, or lentils. Unsalted nuts, nut butters, and seeds. Unsalted canned beans. Lean cuts of beef with fat trimmed off. Low-sodium, lean deli meat. Dairy Low-fat (1%) or fat-free (skim) milk. Fat-free, low-fat, or reduced-fat cheeses. Nonfat, low-sodium ricotta or cottage cheese. Low-fat or nonfat yogurt. Low-fat, low-sodium cheese. Fats and oils Soft margarine without trans fats. Vegetable oil. Low-fat, reduced-fat, or light mayonnaise and salad dressings (reduced-sodium). Canola, safflower, olive, soybean, and sunflower oils. Avocado. Seasoning and other foods Herbs. Spices. Seasoning mixes without salt. Unsalted popcorn and pretzels. Fat-free sweets. What foods are not recommended? The items listed may not be a complete list. Talk with your dietitian about what dietary choices are best for you. Grains Baked goods made with fat, such as croissants, muffins, or some breads. Dry pasta or rice meal packs. Vegetables Creamed or fried vegetables. Vegetables in a cheese sauce. Regular canned vegetables (not low-sodium or reduced-sodium). Regular canned tomato sauce and paste (not low-sodium or reduced-sodium). Regular tomato and vegetable juice (not low-sodium or reduced-sodium). Angie Fava. Olives. Fruits Canned fruit in a light or heavy syrup. Fried fruit. Fruit in cream or butter sauce. Meat and other  protein foods Fatty cuts of meat. Ribs. Fried meat. Berniece Salines. Sausage. Bologna and other processed lunch meats. Salami. Fatback. Hotdogs. Bratwurst. Salted nuts and seeds. Canned beans with added salt. Canned or smoked fish. Whole eggs or egg yolks. Chicken or [place] with skin. Dairy Whole or 2% milk, cream, and half-and-half. Whole or full-fat cream cheese. Whole-fat or sweetened yogurt. Full-fat cheese. Nondairy creamers. Whipped toppings. Processed cheese and cheese spreads. Fats and oils Butter. Stick margarine. Lard. Shortening. Ghee. Bacon fat. Tropical oils, such as coconut, palm kernel, or palm oil. Seasoning and other foods Salted popcorn and pretzels. Onion salt, garlic salt, seasoned salt, table salt, and sea salt. Worcestershire sauce. Tartar sauce. Barbecue sauce. Teriyaki sauce. Soy sauce, including reduced-sodium. Steak sauce. Canned and packaged gravies. Fish sauce. Oyster sauce. Cocktail sauce. Horseradish that you find on the shelf. Ketchup. Mustard. Meat flavorings and tenderizers. Bouillon cubes. Hot sauce and Tabasco sauce. Premade or packaged marinades. Premade or packaged taco seasonings. Relishes. Regular salad dressings. Where to find more information:  National Heart, Lung, and [place]: https://wilson-eaton.com/  American Heart Association: www.heart.org Summary  The DASH eating plan is a healthy eating plan that has been shown to reduce high blood pressure (hypertension). It may also reduce your risk for type 2 diabetes,  heart disease, and stroke.  With the DASH eating plan, you should limit salt (sodium) intake to 2,300 mg a day. If you have hypertension, you may need to reduce your sodium intake to 1,500 mg a day.  When on the DASH eating plan, aim to eat more fresh fruits and vegetables, whole grains, lean proteins, low-fat dairy, and heart-healthy fats.  Work with your health care provider or diet and nutrition specialist (dietitian) to adjust your eating plan to your  individual calorie needs. This information is not intended to replace advice given to you by your health care provider. Make sure you discuss any questions you have with your health care provider. Document Revised: 02/26/2017 Document Reviewed: 03/09/2016 Elsevier Patient Education  2020 [employer].

## 2019-06-27 ENCOUNTER — Encounter: Payer: Self-pay | Admitting: Neurology

## 2019-06-28 NOTE — Progress Notes (Signed)
Reviewed at visit.

## 2019-07-28 ENCOUNTER — Ambulatory Visit: Payer: 59 | Admitting: Internal Medicine

## 2019-07-28 ENCOUNTER — Other Ambulatory Visit: Payer: Self-pay

## 2019-07-28 ENCOUNTER — Telehealth (INDEPENDENT_AMBULATORY_CARE_PROVIDER_SITE_OTHER): Payer: 59 | Admitting: Internal Medicine

## 2019-07-28 ENCOUNTER — Encounter: Payer: Self-pay | Admitting: Internal Medicine

## 2019-07-28 VITALS — BP 136/78 | HR 80 | Temp 98.7°F | Ht 68.0 in | Wt 251.0 lb

## 2019-07-28 DIAGNOSIS — Z79899 Other long term (current) drug therapy: Secondary | ICD-10-CM

## 2019-07-28 DIAGNOSIS — R519 Headache, unspecified: Secondary | ICD-10-CM | POA: Diagnosis not present

## 2019-07-28 DIAGNOSIS — Z8249 Family history of ischemic heart disease and other diseases of the circulatory system: Secondary | ICD-10-CM

## 2019-07-28 DIAGNOSIS — I10 Essential (primary) hypertension: Secondary | ICD-10-CM

## 2019-07-28 MED ORDER — AMLODIPINE BESYLATE 5 MG PO TABS: 5.0000 mg | ORAL_TABLET | Freq: Every day | ORAL | 2 refills | Status: DC

## 2019-07-28 NOTE — Progress Notes (Signed)
Virtual Visit via Video Note  I connected with@ on 07/28/19 at  3:00 PM EDT by a video enabled telemedicine application and verified that I am speaking with the correct person using two identifiers. Location patient: home Location provider:work  office Persons participating in the virtual visit: patient, provider  W[place]th national recommendations  regarding COVID 19 pandemic   video visit is advised over in office visit for this patient.  Patient aware  of the limitations of evaluation and management by telemedicine and  availability of in person appointments. and agreed to proceed.   HPI: Misty Moran presents for video visit  Fu bp  HAs and meds  BP has been better  126/78-148/82  Forgets taking about once a week  But no se  HA  Much better since off caffiene and   Sudafed   Snoring  Only when real tired . tryign to take [place] 2 x per day   Is on screen all day  No unusla inc swelling cp sob.  Has appt neuro in June 3  About headaches   Had vaccine x2 4 -17 pfizer  Did well  Has been taking potassium supplement  Because she heard it helped with BP  ROS: See pertinent positives and negatives per HPI.  Past Medical History:  Diagnosis Date  . Anemia    ? from periods on iron a long time  . Diverticulosis    5-6 years ago  . 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 Swelling of right lower extremity hx of none currently  . Thyroid cancer ([place])   . Thyroid disease    left side enlarged, biopsy done by dr Constance Holster 3 weeks ago, results normal  . Transfusion history 2014   with partial bowel resection    Past Surgical History:  Procedure Laterality Date  . BIOPSY THYROID Left 2014   Medial benign follicular lesion left  . BREAST REDUCTION SURGERY Bilateral 2012  . [place] and 2002  . CHOLECYSTECTOMY  1987  . COSMETIC SURGERY     tummy tuck breast reduction  lipossuction  . PARTIAL COLECTOMY N/A 11/18/2012    Procedure: OPEN SIGMOID COLECTOMY, splenic flexure mobilization, rigid protoscope;  Surgeon: Adin Hector, MD;  Location: [facility];  Service: General;  Laterality: N/A;  . removal of renal lesion benign   2011  . THYROIDECTOMY Right 1999   thyroid cancer  . tummy tuck  2012    Family History  Problem Relation Age of Onset  . Hypertension Mother   . Stroke Maternal Grandmother   . Breast cancer Maternal Grandmother        great  . Crohn's disease Maternal Grandmother        colitis  . Colon cancer Maternal Uncle        great  . Crohn's disease Maternal Aunt        colitis, diverticulosis  . Protein S deficiency Maternal Aunt     Social History   Tobacco Use  . Smoking status: Former Smoker    Packs/day: 1.00    Years: 9.00    Pack years: 9.00    Types: Cigarettes    Quit date: 03/30/1989    Years since quitting: 30.3  . Smokeless tobacco: Never Used  Substance Use Topics  . Alcohol use: Yes    Comment: rare  . Drug use: No  Current Outpatient Medications:  .  amLODipine (NORVASC) 5 MG tablet, Take 1 tablet (5 mg total) by mouth daily., Disp: 90 tablet, Rfl: 2 .  BIOTIN PO, Take 600 mcg by mouth daily., Disp: , Rfl:  .  Cholecalciferol (VITAMIN D PO), Take 2,000 Units by mouth daily., Disp: , Rfl:  .  ferrous sulfate 325 (65 FE) MG tablet, TAKE 1 TABLET BY MOUTH WITH BREAKFAST, Disp: 30 tablet, Rfl: 5 .  magnesium 30 MG tablet, Take 30 mg by mouth 2 (two) times daily., Disp: , Rfl:  .  potassium chloride (KLOR-CON) 10 MEQ tablet, Take 10 mEq by mouth daily., Disp: , Rfl:   EXAM: BP Readings from Last 3 Encounters:  07/28/19 136/78  06/26/19 120/80  06/05/19 (!) 157/95    VITALS per patient if applicable:  GENERAL: alert, oriented, appears well and in no acute distress looks more relaxed and comfortable   HEENT: atraumatic, conjunttiva clear, no obvious abnormalities on inspection of external nose and ears  NECK: normal movements of the head and  neck  LUNGS: on inspection no signs of respiratory distress, breathing rate appears normal, no obvious gross SOB, gasping or wheezing  CV: no obvious cyanosis  MS: moves all visible extremities without noticeable abnormality  PSYCH/NEURO: pleasant and cooperative, no obvious depression or anxiety, speech and thought processing grossly intact Lab Results  Component Value Date   WBC 5.9 06/20/2019   HGB 13.5 06/20/2019   HCT 40.8 06/20/2019   PLT 253.0 06/20/2019   GLUCOSE 95 06/20/2019   CHOL 221 (H) 06/20/2019   TRIG 67.0 06/20/2019   HDL 52.20 06/20/2019   LDLCALC 155 (H) 06/20/2019   ALT 21 06/20/2019   AST 19 06/20/2019   NA 138 06/20/2019   K 4.1 06/20/2019   CL 102 06/20/2019   CREATININE 0.72 06/20/2019   BUN 12 06/20/2019   CO2 27 06/20/2019   TSH 2.17 06/20/2019   HGBA1C 5.7 06/20/2019   MICROALBUR 3.6 (H) 06/20/2019    ASSESSMENT AND PLAN:  Discussed the following assessment and plan:    ICD-10-CM   1. Hypertension, unspecified type  I10    getting better  on amlodipine and other measure may need to add  furhter med but conlsi and fu  3 months  or as needed  2. Left-sided headache  R51.9    improved   3. Family history of cerebral aneurysm  Z82.49   4. Frequent headaches  R51.9   5. Medication management  Z79.899    Much improved bp still may not be at goal but out of the alarm levels  In liew of adding medication will work on more meticulous adherence  Perhaps pill box and  LSI  Over the next few months   But let us know if bp consistently 150 and above  Earlier   Counseled.  Prefer high potassium low sodium  foods than  Pills of potassium   Still calendar headaches     keep appt with Dr Tomi Likens  Any way since the severity and presentation of  headaches are   Atypical and post menopausal  And fam hx of cerebral aneurysm  She is over due for PV care   Plan cpx in about 3 months the summer     Expectant management and discussion of plan and  treatment with opportunity to ask questions and all were answered. The patient agreed with the plan and demonstrated an understanding of the instructions.   Advised to call back or  seek an in-person evaluation if worsening  or having  further concerns . Return for 3 mos cpx and bp med check  in person or as needed .   Shanon Ace, MD

## 2019-08-30 NOTE — Progress Notes (Signed)
NEUROLOGY CONSULTATION NOTE  Misty Moran MRN: [id number] DOB: [birth year]  Referring provider: Shanon Ace, MD Primary care provider: Shanon Ace, MD  Reason for consult:  Headache; family history of cerebral aneurysm  HISTORY OF PRESENT ILLNESS: Misty Moran is a 54 year old right-handed female with HTN and history of thyroid cancer who presents for headaches.  History supplemented by referring provider's notes.  She started having headaches about 6 months ago.  Remote history of migraines which were different.  It was a constant left posterior temporal pressure-like pain with right perorbital pressure which would fluctuate in intensity.  Some photophobia and sometimes mild dizziness but no nausea, vomiting, slurred speech, numbness, weakness, phonophobia or visual disturbance.  BC powder not effective.  She was concerned because her maternal grandmother had passed away due to a brain aneurysm in her 35s.  It was determined that she had elevated blood pressure ([id number] systolic).  She was started on amlodipine 4 months ago and headaches resolved about a week after that.  She had one headache a couple of weeks ago.  Today, she has a dull pressure over her left eye.  Otherwise, no headaches.     PAST MEDICAL HISTORY: Past Medical History:  Diagnosis Date  . Anemia    ? from periods on iron a long time  . Diverticulosis    5-6 years ago  . 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 Swelling of right lower extremity hx of none currently  . Thyroid cancer ([place])   . Thyroid disease    left side enlarged, biopsy done by dr Constance Holster 3 weeks ago, results normal  . Transfusion history 2014   with partial bowel resection    PAST SURGICAL HISTORY: Past Surgical History:  Procedure Laterality Date  . BIOPSY THYROID Left 2014   Medial benign follicular lesion left  . BREAST REDUCTION SURGERY Bilateral 2012  . [place] and 2002  . CHOLECYSTECTOMY  1987  . COSMETIC SURGERY     tummy tuck breast reduction  lipossuction  . PARTIAL COLECTOMY N/A 11/18/2012   Procedure: OPEN SIGMOID COLECTOMY, splenic flexure mobilization, rigid protoscope;  Surgeon: Adin Hector, MD;  Location: [facility];  Service: General;  Laterality: N/A;  . removal of renal lesion benign   2011  . THYROIDECTOMY Right 1999   thyroid cancer  . tummy tuck  2012    MEDICATIONS: Current Outpatient Medications on File Prior to Visit  Medication Sig Dispense Refill  . amLODipine (NORVASC) 5 MG tablet Take 1 tablet (5 mg total) by mouth daily. 90 tablet 2  . BIOTIN PO Take 600 mcg by mouth daily.    . Cholecalciferol (VITAMIN D PO) Take 2,000 Units by mouth daily.    . ferrous sulfate 325 (65 FE) MG tablet TAKE 1 TABLET BY MOUTH WITH BREAKFAST 30 tablet 5  . magnesium 30 MG tablet Take 30 mg by mouth 2 (two) times daily.    . potassium chloride (KLOR-CON) 10 MEQ tablet Take 10 mEq by mouth daily.     No current facility-administered medications on file prior to visit.    ALLERGIES: Allergies  Allergen Reactions  . Ciprofloxacin Hcl Anaphylaxis  . Peanut-Containing Drug Products Anaphylaxis  . Percocet [Oxycodone-Acetaminophen] Itching    FAMILY HISTORY: Family History  Problem Relation Age of Onset  . Hypertension Mother   . Stroke Maternal Grandmother   .  Breast cancer Maternal Grandmother        great  . Crohn's disease Maternal Grandmother        colitis  . Colon cancer Maternal Uncle        great  . Crohn's disease Maternal Aunt        colitis, diverticulosis  . Protein S deficiency Maternal Aunt    SOCIAL HISTORY: Social History   Socioeconomic History  . Marital status: Divorced    Spouse name: Not on file  . Number of children: 2  . Years of education: Not on file  . Highest education level: Not on file  Occupational History  . Occupation: self employed  Tobacco Use  . Smoking status: Former Smoker     Packs/day: 1.00    Years: 9.00    Pack years: 9.00    Types: Cigarettes    Quit date: 03/30/1989    Years since quitting: 30.4  . Smokeless tobacco: Never Used  Substance and Sexual Activity  . Alcohol use: Yes    Comment: rare  . Drug use: No  . Sexual activity: Not on file  Other Topics Concern  . 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          Social Determinants of Health   Financial Resource Strain:   . Difficulty of Paying Living Expenses:   Food Insecurity:   . Worried About Charity fundraiser in the Last Year:   . Arboriculturist in the Last Year:   Transportation Needs:   . Film/video editor (Medical):   Marland Kitchen Lack of Transportation (Non-Medical):   Physical Activity:   . Days of Exercise per Week:   . Minutes of Exercise per Session:   Stress:   . Feeling of Stress :   Social Connections:   . Frequency of Communication with Friends and Family:   . Frequency of Social Gatherings with Friends and Family:   . Attends Religious Services:   . Active Member of Clubs or Organizations:   . Attends Archivist Meetings:   Marland Kitchen Marital Status:   Intimate Partner Violence:   . Fear of Current or Ex-Partner:   . Emotionally Abused:   Marland Kitchen Physically Abused:   . Sexually Abused:     PHYSICAL EXAM: Blood pressure (!) 135/93, pulse 80, height 5\' 8"  (1.727 m), weight 266 lb 3.2 oz (120.7 kg), SpO2 96 %. General: No acute distress.  Patient appears well-groomed.  Head:  Normocephalic/atraumatic Eyes:  fundi examined but not visualized Neck: supple, no paraspinal tenderness, full range of motion Back: No paraspinal tenderness Heart: regular rate and rhythm Lungs: Clear to auscultation bilaterally. Vascular: No carotid bruits. Neurological Exam: Mental status: alert and oriented to person, place, and time, recent and remote  memory intact, fund of knowledge intact, attention and concentration intact, speech fluent and not dysarthric, language intact. Cranial nerves: CN I: not tested CN II: pupils equal, round and reactive to light, visual fields intact CN III, IV, VI:  full range of motion, no nystagmus, no ptosis CN V: facial sensation intact CN VII: upper and lower face symmetric CN VIII: hearing intact CN IX, X: gag intact, uvula midline CN XI: sternocleidomastoid and trapezius muscles intact CN XII:  tongue midline Bulk & Tone: normal, no fasciculations. Motor:  5/5 throughout  Sensation:  Pinprick and vibration sensation intact. Deep Tendon Reflexes:  2+ throughout, toes downgoing.  Finger to nose testing:  Without dysmetria.  Heel to shin:  Without dysmetria.  Gait:  Normal station and stride.  Romberg negative.  IMPRESSION: New daily persistent history in setting of elevated blood pressure, resolved following treatment of high blood pressure.   Family history of cerebral aneurysm. Hypertension, newly-diagnosed  Headaches likely secondary to elevated blood pressure, however given family history, I think imaging is warranted.  PLAN: 1.  CTA of head 2.  Limit use of pain relievers to no more than 2 days out of week to prevent risk of rebound or medication-overuse headache. 3.  Further recommendations pending results.  Thank you for allowing me to take part in the care of this patient.  Metta Clines, DO  CC: Shanon Ace, MD

## 2019-08-31 ENCOUNTER — Encounter: Payer: Self-pay | Admitting: Neurology

## 2019-08-31 ENCOUNTER — Ambulatory Visit (INDEPENDENT_AMBULATORY_CARE_PROVIDER_SITE_OTHER): Payer: 59 | Admitting: Neurology

## 2019-08-31 ENCOUNTER — Other Ambulatory Visit: Payer: Self-pay

## 2019-08-31 VITALS — BP 135/93 | HR 80 | Ht 68.0 in | Wt 266.2 lb

## 2019-08-31 DIAGNOSIS — Z8249 Family history of ischemic heart disease and other diseases of the circulatory system: Secondary | ICD-10-CM

## 2019-08-31 DIAGNOSIS — I10 Essential (primary) hypertension: Secondary | ICD-10-CM

## 2019-08-31 DIAGNOSIS — G4452 New daily persistent headache (NDPH): Secondary | ICD-10-CM

## 2019-08-31 NOTE — Patient Instructions (Addendum)
1.  We will check CTA of head. We have sent a referral to [facility] for your MRI and they will call you directly to schedule your appointment. They are located at [place]. If you need to contact them directly please call [phone].  2.  Limit use of pain relievers to no more than 2 days out of week to prevent risk of rebound or medication-overuse headache. 3.  Further recommendations pending results.

## 2019-09-05 ENCOUNTER — Encounter: Payer: Self-pay | Admitting: Neurology

## 2019-09-05 NOTE — Progress Notes (Signed)
Faxed PA forms to [facility]- awaiting response with PA approval #.

## 2019-09-08 ENCOUNTER — Telehealth: Payer: Self-pay | Admitting: Neurology

## 2019-09-08 NOTE — Telephone Encounter (Addendum)
Called patient to let her know that we have approval from insurance and she is all set for her appt on 6/26.

## 2019-09-08 NOTE — Telephone Encounter (Signed)
Patient left a message on the VM stating that she has a MRI sch for 09-22-19 and [facility] requires a Auth for the MRI.  Please call patient

## 2019-09-22 ENCOUNTER — Ambulatory Visit
Admission: RE | Admit: 2019-09-22 | Discharge: 2019-09-22 | Disposition: A | Discharge status: Home / Self Care | Payer: 59 | Source: Ambulatory Visit | Attending: Neurology | Admitting: Neurology

## 2019-09-22 DIAGNOSIS — I10 Essential (primary) hypertension: Secondary | ICD-10-CM

## 2019-09-22 DIAGNOSIS — G4452 New daily persistent headache (NDPH): Secondary | ICD-10-CM

## 2019-09-22 DIAGNOSIS — Z8249 Family history of ischemic heart disease and other diseases of the circulatory system: Secondary | ICD-10-CM

## 2019-09-22 MED ORDER — IOPAMIDOL (ISOVUE-370) INJECTION 76%
75.0000 mL | Freq: Once | INTRAVENOUS | Status: AC | PRN
  Administered 2019-09-22: 75 mL via INTRAVENOUS

## 2019-09-26 ENCOUNTER — Telehealth: Payer: Self-pay

## 2019-09-26 NOTE — Telephone Encounter (Signed)
-----   Message from Pieter Partridge, DO sent at 09/25/2019  6:25 AM EDT ----- No blood concerning blood vessel abnormalities such as aneurysm seen on CT.  No further recommendations at this time.

## 2019-09-26 NOTE — Telephone Encounter (Signed)
Pt advised of MRI results.

## 2020-02-06 ENCOUNTER — Other Ambulatory Visit: Payer: Self-pay | Admitting: Internal Medicine

## 2020-02-06 NOTE — Telephone Encounter (Signed)
Last VV 07/28/19 Last fill 07/28/19  #90/2

## 2020-03-18 ENCOUNTER — Other Ambulatory Visit: Payer: Self-pay | Admitting: Internal Medicine

## 2020-09-13 ENCOUNTER — Other Ambulatory Visit: Payer: Self-pay | Admitting: Internal Medicine

## 2020-10-12 ENCOUNTER — Other Ambulatory Visit: Payer: Self-pay | Admitting: Internal Medicine

## 2020-11-11 ENCOUNTER — Other Ambulatory Visit: Payer: Self-pay | Admitting: Internal Medicine

## 2020-11-12 ENCOUNTER — Other Ambulatory Visit: Payer: Self-pay | Admitting: Internal Medicine

## 2020-12-21 ENCOUNTER — Other Ambulatory Visit: Payer: Self-pay | Admitting: Internal Medicine

## 2020-12-23 NOTE — Telephone Encounter (Signed)
Last OV: 06/26/19 Medication has been refilled for 1mo x 30days in aug 2022.   LVM requesting pt call office to schedule appt.  Will also send mychart message.

## 2020-12-26 ENCOUNTER — Telehealth (INDEPENDENT_AMBULATORY_CARE_PROVIDER_SITE_OTHER): Payer: 59 | Admitting: Internal Medicine

## 2020-12-26 ENCOUNTER — Encounter: Payer: Self-pay | Admitting: Internal Medicine

## 2020-12-26 ENCOUNTER — Other Ambulatory Visit: Payer: Self-pay | Admitting: Internal Medicine

## 2020-12-26 VITALS — BP 145/80 | HR 82 | Temp 97.8°F | Ht 68.0 in | Wt 266.0 lb

## 2020-12-26 DIAGNOSIS — E079 Disorder of thyroid, unspecified: Secondary | ICD-10-CM

## 2020-12-26 DIAGNOSIS — Z862 Personal history of diseases of the blood and blood-forming organs and certain disorders involving the immune mechanism: Secondary | ICD-10-CM

## 2020-12-26 DIAGNOSIS — Z79899 Other long term (current) drug therapy: Secondary | ICD-10-CM | POA: Diagnosis not present

## 2020-12-26 DIAGNOSIS — I10 Essential (primary) hypertension: Secondary | ICD-10-CM | POA: Diagnosis not present

## 2020-12-26 MED ORDER — AMLODIPINE BESYLATE 5 MG PO TABS: 5.0000 mg | ORAL_TABLET | Freq: Every day | ORAL | 0 refills | Status: DC

## 2020-12-26 NOTE — Progress Notes (Signed)
Virtual Visit via Video Note  I connected with Misty Moran  on 12/26/20 at  8:30 AM EDT by a video enabled telemedicine application and verified that I am speaking with the correct person using two identifiers. Location patient: home Location provider: home office Persons participating in the virtual visit: patient, provider  WIth national recommendations  regarding COVID 19 pandemic   video visit is advised over in office visit for this patient.  Patient aware  of the limitations of evaluation and management by telemedicine and  availability of in person appointments. and agreed to proceed.   HPI: Misty Moran presents for video visit  Failed to have fu cpx or visit last year as per last note.  But feels doing ok .   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 She states she is doing pretty well blood pressures down 150/90 130/80 depending on amlodipine taking potassium may be 2-3 times a week at most. She is going to pay attention to her weight is gone up thinking about getting a trainer in October.  Barriers are  working from home and snacking out of boredom and also job of selling food and snacks.  Having to avoid COVID risk exposure because of her mom's condition. Still taking iron because of her history of anemia.  And iron deficiency.      ROS: See pertinent positives and negatives per HPI.  Getting heel pain sometimes worse in the morning feels like a bruised bone.  No injury.  Past Medical History:  Diagnosis Date   Anemia    ? from periods on iron a long time   Diverticulosis    5-6 years ago   Lesion of right native kidney    MRI right Bosniak type 3 under eval to have surgery to remove   Swelling of right lower extremity hx of none currently   Thyroid cancer ([place])    Thyroid disease    left side enlarged, biopsy done by dr Constance Holster 3 weeks ago, results normal   Transfusion history 2014   with partial bowel resection     Past Surgical History:  Procedure Laterality Date   BIOPSY THYROID Left 2014   Medial benign follicular lesion left   BREAST REDUCTION SURGERY Bilateral 2012   [place] and 2002   [place]     tummy tuck breast reduction  lipossuction   PARTIAL COLECTOMY N/A 11/18/2012   Procedure: OPEN SIGMOID COLECTOMY, splenic flexure mobilization, rigid protoscope;  Surgeon: Adin Hector, MD;  Location: [facility];  Service: General;  Laterality: N/A;   removal of renal lesion benign   2011   THYROIDECTOMY Right 1999   thyroid cancer   tummy tuck  2012    Family History  Problem Relation Age of Onset   Hypertension Mother    Stroke Maternal Grandmother    Breast cancer Maternal Grandmother        great   Crohn's disease Maternal Grandmother        colitis   Colon cancer Maternal Uncle        great   Crohn's disease Maternal Aunt        colitis, diverticulosis   Protein S deficiency Maternal Aunt     Social History   Tobacco Use   Smoking status: Former    Packs/day: 1.00    Years: 9.00    Pack  years: 9.00    Types: Cigarettes    Quit date: 03/30/1989    Years since quitting: 31.7   Smokeless tobacco: Never  Vaping Use   Vaping Use: Never used  Substance Use Topics   Alcohol use: Yes    Comment: rare   Drug use: No      Current Outpatient Medications:    BIOTIN PO, Take 600 mcg by mouth daily., Disp: , Rfl:    Cholecalciferol (VITAMIN D PO), Take 2,000 Units by mouth daily., Disp: , Rfl:    ferrous sulfate 325 (65 FE) MG tablet, TAKE 1 TABLET BY MOUTH WITH BREAKFAST, Disp: 30 tablet, Rfl: 5   magnesium 30 MG tablet, Take 30 mg by mouth 2 (two) times daily., Disp: , Rfl:    potassium chloride (KLOR-CON) 10 MEQ tablet, Take 10 mEq by mouth daily., Disp: , Rfl:    amLODipine (NORVASC) 5 MG tablet, Take 1 tablet (5 mg total) by mouth daily., Disp: 90 tablet, Rfl: 0  EXAM: BP Readings from Last 3 Encounters:  12/26/20 (!)  145/80  08/31/19 (!) 135/93  07/28/19 136/78    VITALS per patient if applicable:  GENERAL: alert, oriented, appears well and in no acute distress  HEENT: atraumatic, conjunttiva clear, no obvious abnormalities on inspection of external nose and ears  NECK: normal movements of the head and neck  LUNGS: on inspection no signs of respiratory distress, breathing rate appears normal, no obvious gross SOB, gasping or wheezing  CV: no obvious cyanosis PSYCH/NEURO: pleasant and cooperative, no obvious depression or anxiety, speech and thought processing grossly intact Lab Results  Component Value Date   WBC 5.9 06/20/2019   HGB 13.5 06/20/2019   HCT 40.8 06/20/2019   PLT 253.0 06/20/2019   GLUCOSE 95 06/20/2019   CHOL 221 (H) 06/20/2019   TRIG 67.0 06/20/2019   HDL 52.20 06/20/2019   LDLCALC 155 (H) 06/20/2019   ALT 21 06/20/2019   AST 19 06/20/2019   NA 138 06/20/2019   K 4.1 06/20/2019   CL 102 06/20/2019   CREATININE 0.72 06/20/2019   BUN 12 06/20/2019   CO2 27 06/20/2019   TSH 2.17 06/20/2019   HGBA1C 5.7 06/20/2019   MICROALBUR 3.6 (H) 06/20/2019    ASSESSMENT AND PLAN:  Discussed the following assessment and plan:    ICD-10-CM   1. Hypertension, unspecified type  F57 Basic metabolic panel    CBC with Differential/Platelet    Hemoglobin A1c    Hepatic function panel    Lipid panel    TSH    Iron, TIBC and Ferritin Panel    Amb Ref to Medical Weight Management    2. Medication management  D22.025 Basic metabolic panel    CBC with Differential/Platelet    Hemoglobin A1c    Hepatic function panel    Lipid panel    TSH    Iron, TIBC and Ferritin Panel    3. Obesity, Class III, BMI 40-49.9 (morbid obesity) (HCC)  K27.06 Basic metabolic panel    CBC with Differential/Platelet    Hemoglobin A1c    Hepatic function panel    Lipid panel    TSH    Iron, TIBC and Ferritin Panel    Amb Ref to Medical Weight Management    4. Thyroid disease  C37.6 Basic  metabolic panel    CBC with Differential/Platelet    Hemoglobin A1c    Hepatic function panel    Lipid panel    TSH  Iron, TIBC and Ferritin Panel    5. History of anemia  M85.9 Basic metabolic panel    CBC with Differential/Platelet    Hemoglobin A1c    Hepatic function panel    Lipid panel    TSH    Iron, TIBC and Ferritin Panel    Overdue for lab monitoring visit etc. reviewed that blood pressure goal would be best 130/80 and not in the 150 range Discussed weight management she is interested we will do referral. Plan fasting labs prefers a date For convenience Will refill amlodipine 90 at this time Because of her history of low potassium consideration of adding a spironolactone if needed. In regard to the heel consider taking measures as if Planter fasciitis and can address when she gets in the office.   Counseled.   Expectant management and discussion of plan and treatment with opportunity to ask questions and all were answered. The patient agreed with the plan and demonstrated an understanding of the instructions.   Advised to call back or seek an in-person evaluation if worsening  or having  further concerns . Return in about 3 months (around 03/27/2021) for fasting lab ELAM  now  then cpx in next 3 mos  or as indicated , preventive /cpx and medications.    Shanon Ace, MD

## 2021-02-26 ENCOUNTER — Other Ambulatory Visit: Payer: Self-pay | Admitting: Internal Medicine

## 2021-03-12 ENCOUNTER — Other Ambulatory Visit: Payer: Self-pay | Admitting: Internal Medicine

## 2021-04-12 ENCOUNTER — Other Ambulatory Visit: Payer: Self-pay | Admitting: Internal Medicine

## 2021-11-26 ENCOUNTER — Inpatient Hospital Stay (HOSPITAL_COMMUNITY)
Admission: EM | Admit: 2021-11-26 | Discharge: 2021-11-28 | DRG: 355 | Disposition: A | Discharge status: Home / Self Care | Payer: Self-pay | Attending: Surgery | Admitting: Surgery

## 2021-11-26 ENCOUNTER — Encounter (HOSPITAL_COMMUNITY): Payer: Self-pay

## 2021-11-26 ENCOUNTER — Emergency Department (HOSPITAL_COMMUNITY): Payer: Self-pay

## 2021-11-26 ENCOUNTER — Other Ambulatory Visit: Payer: Self-pay

## 2021-11-26 DIAGNOSIS — K56609 Unspecified intestinal obstruction, unspecified as to partial versus complete obstruction: Secondary | ICD-10-CM | POA: Diagnosis present

## 2021-11-26 DIAGNOSIS — Z9049 Acquired absence of other specified parts of digestive tract: Secondary | ICD-10-CM

## 2021-11-26 DIAGNOSIS — Z91010 Allergy to peanuts: Secondary | ICD-10-CM

## 2021-11-26 DIAGNOSIS — K436 Other and unspecified ventral hernia with obstruction, without gangrene: Principal | ICD-10-CM

## 2021-11-26 DIAGNOSIS — Z87891 Personal history of nicotine dependence: Secondary | ICD-10-CM

## 2021-11-26 DIAGNOSIS — Z881 Allergy status to other antibiotic agents status: Secondary | ICD-10-CM

## 2021-11-26 DIAGNOSIS — K460 Unspecified abdominal hernia with obstruction, without gangrene: Secondary | ICD-10-CM | POA: Diagnosis present

## 2021-11-26 DIAGNOSIS — K420 Umbilical hernia with obstruction, without gangrene: Principal | ICD-10-CM | POA: Diagnosis present

## 2021-11-26 DIAGNOSIS — Z800 Family history of malignant neoplasm of digestive organs: Secondary | ICD-10-CM

## 2021-11-26 DIAGNOSIS — Z85850 Personal history of malignant neoplasm of thyroid: Secondary | ICD-10-CM

## 2021-11-26 DIAGNOSIS — Z885 Allergy status to narcotic agent status: Secondary | ICD-10-CM

## 2021-11-26 LAB — CBC WITH DIFFERENTIAL/PLATELET
Abs Immature Granulocytes: 0.03 10*3/uL (ref 0.00–0.07)
Basophils Absolute: 0 10*3/uL (ref 0.0–0.1)
Basophils Relative: 0 %
Eosinophils Absolute: 0 10*3/uL (ref 0.0–0.5)
Eosinophils Relative: 0 %
HCT: 46.4 % — ABNORMAL HIGH (ref 36.0–46.0)
Hemoglobin: 15.1 g/dL — ABNORMAL HIGH (ref 12.0–15.0)
Immature Granulocytes: 0 %
Lymphocytes Relative: 6 %
Lymphs Abs: 0.8 10*3/uL (ref 0.7–4.0)
MCH: 29.2 pg (ref 26.0–34.0)
MCHC: 32.5 g/dL (ref 30.0–36.0)
MCV: 89.7 fL (ref 80.0–100.0)
Monocytes Absolute: 0.4 10*3/uL (ref 0.1–1.0)
Monocytes Relative: 3 %
Neutro Abs: 11 10*3/uL — ABNORMAL HIGH (ref 1.7–7.7)
Neutrophils Relative %: 91 %
Platelets: 261 10*3/uL (ref 150–400)
RBC: 5.17 MIL/uL — ABNORMAL HIGH (ref 3.87–5.11)
RDW: 13.4 % (ref 11.5–15.5)
WBC: 12.3 10*3/uL — ABNORMAL HIGH (ref 4.0–10.5)
nRBC: 0 % (ref 0.0–0.2)

## 2021-11-26 LAB — COMPREHENSIVE METABOLIC PANEL
ALT: 30 U/L (ref 0–44)
AST: 24 U/L (ref 15–41)
Albumin: 4.6 g/dL (ref 3.5–5.0)
Alkaline Phosphatase: 74 U/L (ref 38–126)
Anion gap: 14 (ref 5–15)
BUN: 12 mg/dL (ref 6–20)
CO2: 21 mmol/L — ABNORMAL LOW (ref 22–32)
Calcium: 9.8 mg/dL (ref 8.9–10.3)
Chloride: 103 mmol/L (ref 98–111)
Creatinine, Ser: 0.74 mg/dL (ref 0.44–1.00)
GFR, Estimated: >60 mL/min (ref >60)
Glucose, Bld: 124 mg/dL — ABNORMAL HIGH (ref 70–99)
Potassium: 3.5 mmol/L (ref 3.5–5.1)
Sodium: 138 mmol/L (ref 135–145)
Total Bilirubin: 0.8 mg/dL (ref 0.3–1.2)
Total Protein: 8.9 g/dL — ABNORMAL HIGH (ref 6.5–8.1)

## 2021-11-26 LAB — LIPASE, BLOOD: Lipase: 23 U/L (ref 11–51)

## 2021-11-26 MED ORDER — HYDROMORPHONE HCL 2 MG/ML IJ SOLN
1.0000 mg | INTRAMUSCULAR | Status: DC | PRN
  Administered 2021-11-27 (×2): 1 mg via INTRAVENOUS
  Filled 2021-11-26 (×2): qty 1

## 2021-11-26 MED ORDER — DEXTROSE-NACL 5-0.9 % IV SOLN: INTRAVENOUS | Status: DC

## 2021-11-26 MED ORDER — IOHEXOL 300 MG/ML  SOLN
100.0000 mL | Freq: Once | INTRAMUSCULAR | Status: AC | PRN
  Administered 2021-11-26: 100 mL via INTRAVENOUS

## 2021-11-26 MED ORDER — LACTATED RINGERS IV BOLUS
1000.0000 mL | Freq: Once | INTRAVENOUS | Status: AC
  Administered 2021-11-26: 1000 mL via INTRAVENOUS

## 2021-11-26 MED ORDER — ONDANSETRON HCL 4 MG/2ML IJ SOLN
4.0000 mg | Freq: Four times a day (QID) | INTRAMUSCULAR | Status: DC | PRN
Start: 2021-11-26 — End: 2021-11-27
  Administered 2021-11-27: 4 mg via INTRAVENOUS
  Filled 2021-11-26: qty 2

## 2021-11-26 MED ORDER — ONDANSETRON 4 MG PO TBDP: 4.0000 mg | ORAL_TABLET | Freq: Four times a day (QID) | ORAL | Status: DC | PRN

## 2021-11-26 MED ORDER — METOPROLOL TARTRATE 5 MG/5ML IV SOLN: 5.0000 mg | Freq: Four times a day (QID) | INTRAVENOUS | Status: DC | PRN

## 2021-11-26 MED ORDER — FENTANYL CITRATE PF 50 MCG/ML IJ SOSY
100.0000 ug | PREFILLED_SYRINGE | Freq: Once | INTRAMUSCULAR | Status: AC
  Administered 2021-11-26: 100 ug via INTRAVENOUS
  Filled 2021-11-26: qty 2

## 2021-11-26 MED ORDER — ENOXAPARIN SODIUM 40 MG/0.4ML IJ SOSY: 40.0000 mg | PREFILLED_SYRINGE | INTRAMUSCULAR | Status: DC

## 2021-11-26 MED ORDER — AMLODIPINE BESYLATE 5 MG PO TABS
5.0000 mg | ORAL_TABLET | Freq: Every day | ORAL | Status: DC
Start: 2021-11-27 — End: 2021-11-27

## 2021-11-26 NOTE — H&P (Signed)
Misty Moran is an 56 y.o. female.   Chief Complaint: Hernia with nausea and vomiting abdominal pain HPI: Pleasant 56 year old female with a previous history of sigmoid colectomy for diverticulitis 13 years ago.  She has had a hernia just to the right of umbilicus near the incision for a number of years.  Its become more painful in the last 2 weeks and then today became more painful to where the pain became unbearable.  She had 3 bouts of nausea and vomiting.  She has been in the emergency room most of the afternoon getting worked up and she feels much better.  Last recorded bowel movement was over 2 days ago.  She had no p.o. intake for over 36 hours.  She denies any severe pain but is tender over the hernia cyst the right of her umbilicus.  She is always had a bulge there she states it was more pronounced today.  CT scan shows small bowel obstruction at the incisional hernia site to the right of the umbilicus.  Her pain is well controlled and she is quite comfortable at this point time.  Past Medical History:  Diagnosis Date   Anemia    ? from periods on iron a long time   Diverticulosis    5-6 years ago   Lesion of right native kidney    MRI right Bosniak type 3 under eval to have surgery to remove   Swelling of right lower extremity hx of none currently   Thyroid cancer ([employer])    Thyroid disease    left side enlarged, biopsy done by dr Constance Holster 3 weeks ago, results normal   Transfusion history 2014   with partial bowel resection    Past Surgical History:  Procedure Laterality Date   BIOPSY THYROID Left 2014   Medial benign follicular lesion left   BREAST REDUCTION SURGERY Bilateral 2012   [place] and 2002   [place]     tummy tuck breast reduction  lipossuction   PARTIAL COLECTOMY N/A 11/18/2012   Procedure: OPEN SIGMOID COLECTOMY, splenic flexure mobilization, rigid protoscope;  Surgeon: Adin Hector, MD;  Location: [facility];  Service:  General;  Laterality: N/A;   removal of renal lesion benign   2011   THYROIDECTOMY Right 1999   thyroid cancer   tummy tuck  2012    Family History  Problem Relation Age of Onset   Hypertension Mother    Stroke Maternal Grandmother    Breast cancer Maternal Grandmother        great   Crohn's disease Maternal Grandmother        colitis   Colon cancer Maternal Uncle        great   Crohn's disease Maternal Aunt        colitis, diverticulosis   Protein S deficiency Maternal Aunt    Social History:  reports that she quit smoking about 32 years ago. Her smoking use included cigarettes. She has a 9.00 pack-year smoking history. She has never used smokeless tobacco. She reports current alcohol use. She reports that she does not use drugs.  Allergies:  Allergies  Allergen Reactions   Ciprofloxacin Hcl Anaphylaxis   Peanut-Containing Drug Products Anaphylaxis   Percocet [Oxycodone-Acetaminophen] Anaphylaxis and Itching    (Not in a hospital admission)   Results for orders placed or performed during the hospital encounter of 11/26/21 (from the past 48 hour(s))  CBC with Differential  Status: Abnormal   Collection Time: 11/26/21  7:28 PM  Result Value Ref Range   WBC 12.3 (H) 4.0 - 10.5 K/uL   RBC 5.17 (H) 3.87 - 5.11 MIL/uL   Hemoglobin 15.1 (H) 12.0 - 15.0 g/dL   HCT 46.4 (H) 36.0 - 46.0 %   MCV 89.7 80.0 - 100.0 fL   MCH 29.2 26.0 - 34.0 pg   MCHC 32.5 30.0 - 36.0 g/dL   RDW 13.4 11.5 - 15.5 %   Platelets 261 150 - 400 K/uL   nRBC 0.0 0.0 - 0.2 %   Neutrophils Relative % 91 %   Neutro Abs 11.0 (H) 1.7 - 7.7 K/uL   Lymphocytes Relative 6 %   Lymphs Abs 0.8 0.7 - 4.0 K/uL   Monocytes Relative 3 %   Monocytes Absolute 0.4 0.1 - 1.0 K/uL   Eosinophils Relative 0 %   Eosinophils Absolute 0.0 0.0 - 0.5 K/uL   Basophils Relative 0 %   Basophils Absolute 0.0 0.0 - 0.1 K/uL   Immature Granulocytes 0 %   Abs Immature Granulocytes 0.03 0.00 - 0.07 K/uL    Comment: Performed  at [facility], [place] [address], [place], [place] [address]  Comprehensive metabolic panel     Status: Abnormal   Collection Time: 11/26/21  7:28 PM  Result Value Ref Range   Sodium 138 135 - 145 mmol/L   Potassium 3.5 3.5 - 5.1 mmol/L   Chloride 103 98 - 111 mmol/L   CO2 21 (L) 22 - 32 mmol/L   Glucose, Bld 124 (H) 70 - 99 mg/dL    Comment: Glucose reference range applies only to samples taken after fasting for at least 8 hours.   BUN 12 6 - 20 mg/dL   Creatinine, Ser 0.74 0.44 - 1.00 mg/dL   Calcium 9.8 8.9 - 10.3 mg/dL   Total Protein 8.9 (H) 6.5 - 8.1 g/dL   Albumin 4.6 3.5 - 5.0 g/dL   AST 24 15 - 41 U/L   ALT 30 0 - 44 U/L   Alkaline Phosphatase 74 38 - 126 U/L   Total Bilirubin 0.8 0.3 - 1.2 mg/dL   GFR, Estimated >60 >60 mL/min    Comment: (NOTE) Calculated using the CKD-EPI Creatinine Equation (2021)    Anion gap 14 5 - 15    Comment: Performed at [facility], [place] [address] [place], [place] [address]  Lipase, blood     Status: None   Collection Time: 11/26/21  7:28 PM  Result Value Ref Range   Lipase 23 11 - 51 U/L    Comment: Performed at [facility], [place] [address] [place], [place] [address]   CT ABDOMEN PELVIS W CONTRAST  Result Date: 11/26/2021 CLINICAL DATA:  Complicated hernia. EXAM: CT ABDOMEN AND PELVIS WITH CONTRAST TECHNIQUE: Multidetector CT imaging of the abdomen and pelvis was performed using the standard protocol following bolus administration of intravenous contrast. RADIATION DOSE REDUCTION: This exam was performed according to the departmental dose-optimization program which includes automated exposure control, adjustment of the mA and/or kV according to patient size and/or use of iterative reconstruction technique. CONTRAST:  185m OMNIPAQUE IOHEXOL 300 MG/ML  SOLN COMPARISON:  CT examination dated Aug 18, 2012. FINDINGS: Lower chest: No acute abnormality. Hepatobiliary: No focal liver  abnormality is seen. No gallstones, gallbladder wall thickening, or biliary dilatation. Pancreas: Unremarkable. No pancreatic ductal dilatation or surrounding inflammatory changes. Spleen: Normal in size without focal abnormality. Adrenals/Urinary Tract:  Adrenal glands are unremarkable. Kidneys are normal, without renal calculi, focal lesion, or hydronephrosis. Bladder is unremarkable. Stomach/Bowel: Stomach is within normal limits. Appendix appears normal. There is a bowel loop containing right paraumbilical anterior abdominal hernia with a transition point inside the hernial sac. There are dilated proximal small bowel loops measuring up to 3.3 cm consistent with bowel obstruction. No extraluminal free air or abdominal collection/abscess. Distal small-bowel loops and colonic loops are collapsed. The findings are consistent with high-grade bowel obstruction. Vascular/Lymphatic: No significant vascular findings are present. No enlarged abdominal or pelvic lymph nodes. Reproductive: Uterus and bilateral adnexa are unremarkable. Other: Right paraumbilical anterior abdominal wall hernia containing bowel loops with a transition point inside the hernial sac. 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 No acute osseous abnormality. IMPRESSION: 1. Bowel loop containing right paraumbilical hernia with high-grade bowel obstruction. Dilated proximal measuring up to 3.3 cm. Surgical consultation for further management is recommended. 2. No extraluminal free air. 3. Status post cholecystectomy. Above findings were reported to Dr. Regenia Skeeter over the telephone at approximately 9:12 p.m. on 11/26/2021. Electronically Signed   By: Keane Police D.O.   On: 11/26/2021 21:15    Review of Systems  Gastrointestinal:  Positive for abdominal pain, nausea and vomiting.  All other systems reviewed and are negative.   Blood pressure (!) 167/104, pulse 78, temperature 98.2 F (36.8 C),  temperature source Oral, resp. rate 20, height 5' 8.25" (1.734 m), weight 120.2 kg, SpO2 98 %. Physical Exam HENT:     Head: Normocephalic.  Eyes:     Pupils: Pupils are equal, round, and reactive to light.  Cardiovascular:     Rate and Rhythm: Normal rate.  Abdominal:       Comments: Soft hernia just to the right of the umbilicus.  Measures about 3 cm.  It is partially reducible.  No rebound or guarding.  Tender over the hernia though.  Skin:    General: Skin is warm.  Neurological:     General: No focal deficit present.     Mental Status: She is alert.  Psychiatric:        Mood and Affect: Mood normal.        Behavior: Behavior normal.      Assessment/Plan Small obstruction at the level of incisional hernia with partial incarceration without strangulation  The bowel was partially reducible but not completely reducible.  She is comfortable nondistended does not have peritonitis.  She had no nausea or vomiting for the last 8 hours.  At this point time we admitted for observation.  She will need the hernia repair during this admission.  Hold off on NG tube when she vomits.  This appears to be more of a partial small bowel obstruction.  Discussed admission, and potential surgery.  She voices understanding and agrees to proceed.  Turner Daniels, MD 11/26/2021, 10:19 PM   Total time 30 minutes

## 2021-11-26 NOTE — ED Notes (Signed)
Pt ambulatory to bathroom w/o assist 

## 2021-11-26 NOTE — ED Provider Notes (Signed)
[facility] DEPT Provider Note   CSN: [id number] Arrival date & time: 11/26/21  1748     History  Chief Complaint  Patient presents with  . Hernia  . Emesis    Misty Moran is a 56 y.o. female.  HPI 56 year old female presents with acute abdominal pain.  She has been dealing with abdominal pain near her hernia site just right of the umbilicus for a couple weeks.  However it all of a sudden worsened today around 94.  She vomited a couple times but none since.  She states that when she lays flat or at least does not set up she feels pretty good and denies any significant pain.  Home Medications Prior to Admission medications   Medication Sig Start Date End Date Taking? Authorizing Provider  amLODipine (NORVASC) 5 MG tablet TAKE ONE TABLET BY MOUTH ONE TIME DAILY 03/12/21  Yes Panosh, Standley Brooking, MD  BIOTIN PO Take 600 mcg by mouth daily.   Yes [provider]  Cholecalciferol (VITAMIN D PO) Take 2,000 Units by mouth daily.   Yes [provider]  ferrous sulfate 325 (65 FE) MG tablet TAKE 1 TABLET BY MOUTH WITH BREAKFAST   Yes Panosh, Standley Brooking, MD      Allergies    Ciprofloxacin hcl, Peanut-containing drug products, and Percocet [oxycodone-acetaminophen]    Review of Systems   Review of Systems  Gastrointestinal:  Positive for abdominal pain and vomiting.    Physical Exam Updated Vital Signs BP (!) 135/119   Pulse 82   Temp 98.2 F (36.8 C) (Oral)   Resp 17   Ht 5' 8.25" (1.734 m)   Wt 120.2 kg   SpO2 97%   BMI 40.00 kg/m  Physical Exam Vitals and nursing note reviewed.  Constitutional:      Appearance: She is well-developed. She is obese.  HENT:     Head: Normocephalic and atraumatic.  Cardiovascular:     Rate and Rhythm: Normal rate and regular rhythm.     Heart sounds: Normal heart sounds.  Pulmonary:     Effort: Pulmonary effort is normal.     Breath sounds: Normal breath sounds.  Abdominal:     Palpations:  Abdomen is soft.     Tenderness: There is abdominal tenderness in the right upper quadrant and periumbilical area.    Skin:    General: Skin is warm and dry.  Neurological:     Mental Status: She is alert.    ED Results / Procedures / Treatments   Labs (all labs ordered are listed, but only abnormal results are displayed) Labs Reviewed  CBC WITH DIFFERENTIAL/PLATELET - Abnormal; Notable for the following components:      Result Value   WBC 12.3 (*)    RBC 5.17 (*)    Hemoglobin 15.1 (*)    HCT 46.4 (*)    Neutro Abs 11.0 (*)    All other components within normal limits  COMPREHENSIVE METABOLIC PANEL - Abnormal; Notable for the following components:   CO2 21 (*)    Glucose, Bld 124 (*)    Total Protein 8.9 (*)    All other components within normal limits  LIPASE, BLOOD  URINALYSIS, ROUTINE W REFLEX MICROSCOPIC    EKG None  Radiology CT ABDOMEN PELVIS W CONTRAST  Result Date: 11/26/2021 CLINICAL DATA:  Complicated hernia. EXAM: CT ABDOMEN AND PELVIS WITH CONTRAST TECHNIQUE: Multidetector CT imaging of the abdomen and pelvis was performed using the standard protocol following  bolus administration of intravenous contrast. RADIATION DOSE REDUCTION: This exam was performed according to the departmental dose-optimization program which includes automated exposure control, adjustment of the mA and/or kV according to patient size and/or use of iterative reconstruction technique. CONTRAST:  132m OMNIPAQUE IOHEXOL 300 MG/ML  SOLN COMPARISON:  CT examination dated Aug 18, 2012. FINDINGS: Lower chest: No acute abnormality. Hepatobiliary: No focal liver abnormality is seen. No gallstones, gallbladder wall thickening, or biliary dilatation. Pancreas: Unremarkable. No pancreatic ductal dilatation or surrounding inflammatory changes. Spleen: Normal in size without focal abnormality. Adrenals/Urinary Tract: Adrenal glands are unremarkable. Kidneys are normal, without renal calculi, focal lesion,  or hydronephrosis. Bladder is unremarkable. Stomach/Bowel: Stomach is within normal limits. Appendix appears normal. There is a bowel loop containing right paraumbilical anterior abdominal hernia with a transition point inside the hernial sac. There are dilated proximal small bowel loops measuring up to 3.3 cm consistent with bowel obstruction. No extraluminal free air or abdominal collection/abscess. Distal small-bowel loops and colonic loops are collapsed. The findings are consistent with high-grade bowel obstruction. Vascular/Lymphatic: No significant vascular findings are present. No enlarged abdominal or pelvic lymph nodes. Reproductive: Uterus and bilateral adnexa are unremarkable. Other: Right paraumbilical anterior abdominal wall hernia containing bowel loops with a transition point inside the hernial sac. 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 No acute osseous abnormality. IMPRESSION: 1. Bowel loop containing right paraumbilical hernia with high-grade bowel obstruction. Dilated proximal measuring up to 3.3 cm. Surgical consultation for further management is recommended. 2. No extraluminal free air. 3. Status post cholecystectomy. Above findings were reported to Dr. GRegenia Skeeterover the telephone at approximately 9:12 p.m. on 11/26/2021. Electronically Signed   By: IKeane PoliceD.O.   On: 11/26/2021 21:15    Procedures Procedures    Medications Ordered in ED Medications  iohexol (OMNIPAQUE) 300 MG/ML solution 100 mL (100 mLs Intravenous Contrast Given 11/26/21 2047)  fentaNYL (SUBLIMAZE) injection 100 mcg (100 mcg Intravenous Given 11/26/21 2211)  lactated ringers bolus 1,000 mL (1,000 mLs Intravenous New Bag/Given 11/26/21 2210)    ED Course/ Medical Decision Making/ A&P Clinical Course as of 11/26/21 2319  Wed Nov 26, 2021  2109 CT ABDOMEN PELVIS W CONTRAST [GL]    Clinical Course User Index [GL] LAdolphus Birchwood PA-C                            Medical Decision Making Risk Prescription drug management. Decision regarding hospitalization.   CT abdomen and pelvis images viewed/interpreted by myself.  She has an SBO this seems to be coming from an incarcerated hernia.  Labs show a leukocytosis that is likely reactive.  She initially declined a thing for pain though when I discussed potentially trying to reduce the hernia she did want something for pain and was given IV fentanyl.  Seems like I can partially reduce but not fully reduce the hernia.  I discussed with Dr. CBrantley Stage who at this point will admit for observation and likely will need the OR later.  Does not emergently need it.  No NG tube indicated currently as she is not having any vomiting.  Started on IV fluids.  Admit to neurosurgery.         Final Clinical Impression(s) / ED Diagnoses Final diagnoses:  Incarcerated ventral hernia    Rx / DC Orders ED Discharge Orders     None         GRegenia Skeeter  Nicki Reaper, MD 11/26/21 2320

## 2021-11-26 NOTE — ED Provider Triage Note (Signed)
Emergency Medicine Provider Triage Evaluation Note  Misty Moran , a 56 y.o. female  was evaluated in triage.  Pt complains of abdominal pain associate with nausea.  Patient has a hernia. No fever or chills.  Review of Systems  Positive: Abdominal pain Negative: fever  Physical Exam  BP (!) 181/109 (BP Location: Left Arm)   Pulse 100   Temp 98.4 F (36.9 C) (Oral)   Resp (!) 22   SpO2 96%  Gen:   Awake, no distress   Resp:  Normal effort  MSK:   Moves extremities without difficulty  Other:  TTP throughout lower quadrants  Medical Decision Making  Medically screening exam initiated at 6:35 PM.  Appropriate orders placed.  Misty Moran was informed that the remainder of the evaluation will be completed by another provider, this initial triage assessment does not replace that evaluation, and the importance of remaining in the ED until their evaluation is complete.  Abdominal labs CT abdomen   Suzy Bouchard, PA-C 11/26/21 1837

## 2021-11-26 NOTE — ED Notes (Signed)
General surgeon at the bedside

## 2021-11-26 NOTE — ED Notes (Signed)
Ice pack applied to pt's lower abd to hernia

## 2021-11-26 NOTE — ED Notes (Signed)
Attempted to collect UA sample. Pt states she is unable to urinate att. Will re-attempt at a later time

## 2021-11-27 ENCOUNTER — Encounter (HOSPITAL_COMMUNITY): Admission: EM | Disposition: A | Discharge status: Home / Self Care | Payer: Self-pay | Source: Home / Self Care

## 2021-11-27 ENCOUNTER — Encounter (HOSPITAL_COMMUNITY): Payer: Self-pay

## 2021-11-27 ENCOUNTER — Other Ambulatory Visit: Payer: Self-pay

## 2021-11-27 ENCOUNTER — Inpatient Hospital Stay (HOSPITAL_BASED_OUTPATIENT_CLINIC_OR_DEPARTMENT_OTHER): Payer: Self-pay | Admitting: Certified Registered Nurse Anesthetist

## 2021-11-27 ENCOUNTER — Inpatient Hospital Stay (HOSPITAL_COMMUNITY): Payer: Self-pay | Admitting: Certified Registered Nurse Anesthetist

## 2021-11-27 DIAGNOSIS — K420 Umbilical hernia with obstruction, without gangrene: Secondary | ICD-10-CM

## 2021-11-27 DIAGNOSIS — Z6841 Body Mass Index (BMI) 40.0 and over, adult: Secondary | ICD-10-CM

## 2021-11-27 DIAGNOSIS — D649 Anemia, unspecified: Secondary | ICD-10-CM

## 2021-11-27 DIAGNOSIS — K460 Unspecified abdominal hernia with obstruction, without gangrene: Secondary | ICD-10-CM | POA: Diagnosis present

## 2021-11-27 HISTORY — PX: UMBILICAL HERNIA REPAIR: SHX196

## 2021-11-27 LAB — URINALYSIS, ROUTINE W REFLEX MICROSCOPIC
Bacteria, UA: NONE SEEN
Bilirubin Urine: NEGATIVE
Glucose, UA: NEGATIVE mg/dL
Ketones, ur: NEGATIVE mg/dL
Leukocytes,Ua: NEGATIVE
Nitrite: NEGATIVE
Protein, ur: 30 mg/dL — AB
Specific Gravity, Urine: >1.046 — ABNORMAL HIGH (ref 1.005–1.030)
pH: 6 (ref 5.0–8.0)

## 2021-11-27 LAB — COMPREHENSIVE METABOLIC PANEL
ALT: 30 U/L (ref 0–44)
AST: 26 U/L (ref 15–41)
Albumin: 4.3 g/dL (ref 3.5–5.0)
Alkaline Phosphatase: 72 U/L (ref 38–126)
Anion gap: 12 (ref 5–15)
BUN: 10 mg/dL (ref 6–20)
CO2: 24 mmol/L (ref 22–32)
Calcium: 9.8 mg/dL (ref 8.9–10.3)
Chloride: 104 mmol/L (ref 98–111)
Creatinine, Ser: 0.78 mg/dL (ref 0.44–1.00)
GFR, Estimated: >60 mL/min (ref >60)
Glucose, Bld: 124 mg/dL — ABNORMAL HIGH (ref 70–99)
Potassium: 3.8 mmol/L (ref 3.5–5.1)
Sodium: 140 mmol/L (ref 135–145)
Total Bilirubin: 0.9 mg/dL (ref 0.3–1.2)
Total Protein: 8.3 g/dL — ABNORMAL HIGH (ref 6.5–8.1)

## 2021-11-27 LAB — CBC
HCT: 42.7 % (ref 36.0–46.0)
Hemoglobin: 14.3 g/dL (ref 12.0–15.0)
MCH: 29.1 pg (ref 26.0–34.0)
MCHC: 33.5 g/dL (ref 30.0–36.0)
MCV: 87 fL (ref 80.0–100.0)
Platelets: 277 10*3/uL (ref 150–400)
RBC: 4.91 MIL/uL (ref 3.87–5.11)
RDW: 13.2 % (ref 11.5–15.5)
WBC: 10.4 10*3/uL (ref 4.0–10.5)
nRBC: 0 % (ref 0.0–0.2)

## 2021-11-27 LAB — POCT PREGNANCY, URINE: Preg Test, Ur: NEGATIVE

## 2021-11-27 LAB — HIV ANTIBODY (ROUTINE TESTING W REFLEX): HIV Screen 4th Generation wRfx: NONREACTIVE

## 2021-11-27 SURGERY — REPAIR, HERNIA, UMBILICAL, ADULT
Anesthesia: General

## 2021-11-27 MED ORDER — PHENYLEPHRINE 80 MCG/ML (10ML) SYRINGE FOR IV PUSH (FOR BLOOD PRESSURE SUPPORT)
PREFILLED_SYRINGE | INTRAVENOUS | Status: AC
  Filled 2021-11-27: qty 20

## 2021-11-27 MED ORDER — 0.9 % SODIUM CHLORIDE (POUR BTL) OPTIME
TOPICAL | Status: DC | PRN
  Administered 2021-11-27: 1000 mL

## 2021-11-27 MED ORDER — DIPHENHYDRAMINE HCL 25 MG PO CAPS: 25.0000 mg | ORAL_CAPSULE | Freq: Four times a day (QID) | ORAL | Status: DC | PRN

## 2021-11-27 MED ORDER — FENTANYL CITRATE (PF) 100 MCG/2ML IJ SOLN
INTRAMUSCULAR | Status: DC | PRN
  Administered 2021-11-27: 50 ug via INTRAVENOUS

## 2021-11-27 MED ORDER — MIDAZOLAM HCL 5 MG/5ML IJ SOLN
INTRAMUSCULAR | Status: DC | PRN
  Administered 2021-11-27: 2 mg via INTRAVENOUS

## 2021-11-27 MED ORDER — ONDANSETRON HCL 4 MG/2ML IJ SOLN
INTRAMUSCULAR | Status: AC
  Filled 2021-11-27: qty 2

## 2021-11-27 MED ORDER — CHLORHEXIDINE GLUCONATE 0.12 % MT SOLN
15.0000 mL | Freq: Once | OROMUCOSAL | Status: AC
  Administered 2021-11-27: 15 mL via OROMUCOSAL

## 2021-11-27 MED ORDER — EPHEDRINE 5 MG/ML INJ
INTRAVENOUS | Status: AC
  Filled 2021-11-27: qty 10

## 2021-11-27 MED ORDER — DEXAMETHASONE SODIUM PHOSPHATE 10 MG/ML IJ SOLN
INTRAMUSCULAR | Status: AC
  Filled 2021-11-27: qty 1

## 2021-11-27 MED ORDER — DEXAMETHASONE SODIUM PHOSPHATE 10 MG/ML IJ SOLN
INTRAMUSCULAR | Status: DC | PRN
  Administered 2021-11-27: 4 mg via INTRAVENOUS

## 2021-11-27 MED ORDER — MIDAZOLAM HCL 2 MG/2ML IJ SOLN
INTRAMUSCULAR | Status: AC
  Filled 2021-11-27: qty 2

## 2021-11-27 MED ORDER — ACETAMINOPHEN 500 MG PO TABS
1000.0000 mg | ORAL_TABLET | Freq: Four times a day (QID) | ORAL | Status: DC
  Administered 2021-11-27 – 2021-11-28 (×3): 1000 mg via ORAL
  Filled 2021-11-27 (×3): qty 2

## 2021-11-27 MED ORDER — ONDANSETRON HCL 4 MG/2ML IJ SOLN
INTRAMUSCULAR | Status: DC | PRN
  Administered 2021-11-27: 4 mg via INTRAVENOUS

## 2021-11-27 MED ORDER — KETOROLAC TROMETHAMINE 30 MG/ML IJ SOLN
INTRAMUSCULAR | Status: DC | PRN
  Administered 2021-11-27: 15 mg via INTRAVENOUS

## 2021-11-27 MED ORDER — ONDANSETRON 4 MG PO TBDP: 4.0000 mg | ORAL_TABLET | Freq: Four times a day (QID) | ORAL | Status: DC | PRN

## 2021-11-27 MED ORDER — ONDANSETRON HCL 4 MG/2ML IJ SOLN: 4.0000 mg | Freq: Four times a day (QID) | INTRAMUSCULAR | Status: DC | PRN

## 2021-11-27 MED ORDER — DIPHENHYDRAMINE HCL 50 MG/ML IJ SOLN: 25.0000 mg | Freq: Four times a day (QID) | INTRAMUSCULAR | Status: DC | PRN

## 2021-11-27 MED ORDER — TRAMADOL HCL 50 MG PO TABS: 50.0000 mg | ORAL_TABLET | Freq: Four times a day (QID) | ORAL | Status: DC | PRN

## 2021-11-27 MED ORDER — BUPIVACAINE HCL (PF) 0.5 % IJ SOLN
INTRAMUSCULAR | Status: AC
  Filled 2021-11-27: qty 30

## 2021-11-27 MED ORDER — SUCCINYLCHOLINE CHLORIDE 200 MG/10ML IV SOSY
PREFILLED_SYRINGE | INTRAVENOUS | Status: DC | PRN
  Administered 2021-11-27: 140 mg via INTRAVENOUS

## 2021-11-27 MED ORDER — METHOCARBAMOL 500 MG PO TABS
500.0000 mg | ORAL_TABLET | Freq: Four times a day (QID) | ORAL | Status: DC | PRN
  Administered 2021-11-28: 500 mg via ORAL
  Filled 2021-11-27: qty 1

## 2021-11-27 MED ORDER — CEFOTETAN DISODIUM 2 G IJ SOLR
2.0000 g | INTRAMUSCULAR | Status: AC
  Administered 2021-11-27: 2 g via INTRAVENOUS
  Filled 2021-11-27: qty 2

## 2021-11-27 MED ORDER — AMLODIPINE BESYLATE 5 MG PO TABS
5.0000 mg | ORAL_TABLET | Freq: Every day | ORAL | Status: DC
  Administered 2021-11-27: 5 mg via ORAL
  Filled 2021-11-27: qty 1

## 2021-11-27 MED ORDER — FENTANYL CITRATE (PF) 100 MCG/2ML IJ SOLN
INTRAMUSCULAR | Status: AC
  Filled 2021-11-27: qty 2

## 2021-11-27 MED ORDER — SUCCINYLCHOLINE CHLORIDE 200 MG/10ML IV SOSY
PREFILLED_SYRINGE | INTRAVENOUS | Status: AC
  Filled 2021-11-27: qty 30

## 2021-11-27 MED ORDER — ACETAMINOPHEN 10 MG/ML IV SOLN
INTRAVENOUS | Status: DC | PRN
  Administered 2021-11-27: 1000 mg via INTRAVENOUS

## 2021-11-27 MED ORDER — LACTATED RINGERS IV SOLN: INTRAVENOUS | Status: DC

## 2021-11-27 MED ORDER — PROPOFOL 10 MG/ML IV BOLUS
INTRAVENOUS | Status: DC | PRN
  Administered 2021-11-27: 200 mg via INTRAVENOUS

## 2021-11-27 MED ORDER — PHENYLEPHRINE 80 MCG/ML (10ML) SYRINGE FOR IV PUSH (FOR BLOOD PRESSURE SUPPORT)
PREFILLED_SYRINGE | INTRAVENOUS | Status: DC | PRN
  Administered 2021-11-27: 120 ug via INTRAVENOUS
  Administered 2021-11-27: 160 ug via INTRAVENOUS
  Administered 2021-11-27: 120 ug via INTRAVENOUS

## 2021-11-27 MED ORDER — HYDROMORPHONE HCL 1 MG/ML IJ SOLN: 0.2500 mg | INTRAMUSCULAR | Status: DC | PRN

## 2021-11-27 MED ORDER — ACETAMINOPHEN 10 MG/ML IV SOLN
INTRAVENOUS | Status: AC
  Filled 2021-11-27: qty 100

## 2021-11-27 MED ORDER — POTASSIUM CHLORIDE IN NACL 20-0.9 MEQ/L-% IV SOLN
INTRAVENOUS | Status: DC
  Filled 2021-11-27 (×2): qty 1000

## 2021-11-27 MED ORDER — EPHEDRINE SULFATE-NACL 50-0.9 MG/10ML-% IV SOSY
PREFILLED_SYRINGE | INTRAVENOUS | Status: DC | PRN
  Administered 2021-11-27: 5 mg via INTRAVENOUS

## 2021-11-27 MED ORDER — HYDROMORPHONE HCL 1 MG/ML IJ SOLN: 1.0000 mg | INTRAMUSCULAR | Status: DC | PRN

## 2021-11-27 MED ORDER — PROPOFOL 10 MG/ML IV BOLUS
INTRAVENOUS | Status: AC
  Filled 2021-11-27: qty 20

## 2021-11-27 MED ORDER — LIDOCAINE 2% (20 MG/ML) 5 ML SYRINGE
INTRAMUSCULAR | Status: DC | PRN
  Administered 2021-11-27: 60 mg via INTRAVENOUS

## 2021-11-27 MED ORDER — ENOXAPARIN SODIUM 40 MG/0.4ML IJ SOSY
40.0000 mg | PREFILLED_SYRINGE | INTRAMUSCULAR | Status: DC
  Administered 2021-11-28: 40 mg via SUBCUTANEOUS
  Filled 2021-11-27: qty 0.4

## 2021-11-27 MED ORDER — BUPIVACAINE HCL (PF) 0.5 % IJ SOLN
INTRAMUSCULAR | Status: DC | PRN
  Administered 2021-11-27: 20 mL

## 2021-11-27 SURGICAL SUPPLY — 33 items
ADH SKN CLS APL DERMABOND .7 (GAUZE/BANDAGES/DRESSINGS) ×1
APL PRP STRL LF DISP 70% ISPRP (MISCELLANEOUS) ×1
BAG COUNTER SPONGE SURGICOUNT (BAG) IMPLANT
BAG SPNG CNTER NS LX DISP (BAG)
BINDER ABDOMINAL 12 ML 46-62 (SOFTGOODS) IMPLANT
BLADE SURG 15 STRL LF DISP TIS (BLADE) ×2 IMPLANT
BLADE SURG 15 STRL SS (BLADE) ×1
CHLORAPREP W/TINT 26 (MISCELLANEOUS) ×2 IMPLANT
DERMABOND ADVANCED (GAUZE/BANDAGES/DRESSINGS) ×1
DERMABOND ADVANCED .7 DNX12 (GAUZE/BANDAGES/DRESSINGS) ×2 IMPLANT
DRAPE LAPAROSCOPIC ABDOMINAL (DRAPES) ×2 IMPLANT
ELECT REM PT RETURN 15FT ADLT (MISCELLANEOUS) ×2 IMPLANT
GAUZE SPONGE 4X4 12PLY STRL (GAUZE/BANDAGES/DRESSINGS) IMPLANT
GLOVE BIO SURGEON STRL SZ7.5 (GLOVE) ×2 IMPLANT
GOWN STRL REUS W/ TWL XL LVL3 (GOWN DISPOSABLE) ×4 IMPLANT
GOWN STRL REUS W/TWL XL LVL3 (GOWN DISPOSABLE) ×2
KIT BASIN OR (CUSTOM PROCEDURE TRAY) ×2 IMPLANT
KIT TURNOVER KIT A (KITS) IMPLANT
MESH VENTRALEX ST 2.5 CRC MED (Mesh General) IMPLANT
NEEDLE HYPO 22GX1.5 SAFETY (NEEDLE) ×2 IMPLANT
PACK BASIC VI WITH GOWN DISP (CUSTOM PROCEDURE TRAY) ×2 IMPLANT
PENCIL SMOKE EVACUATOR (MISCELLANEOUS) IMPLANT
SPIKE FLUID TRANSFER (MISCELLANEOUS) IMPLANT
SUT MNCRL AB 4-0 PS2 18 (SUTURE) ×2 IMPLANT
SUT NOVA 1 T20/GS 25DT (SUTURE) IMPLANT
SUT NOVA NAB DX-16 0-1 5-0 T12 (SUTURE) ×2 IMPLANT
SUT VIC AB 3-0 SH 27 (SUTURE) ×1
SUT VIC AB 3-0 SH 27X BRD (SUTURE) ×2 IMPLANT
SUT VICRYL 2 0 18  UND BR (SUTURE)
SUT VICRYL 2 0 18 UND BR (SUTURE) IMPLANT
SYR 20ML LL LF (SYRINGE) ×2 IMPLANT
TOWEL OR 17X26 10 PK STRL BLUE (TOWEL DISPOSABLE) ×2 IMPLANT
TOWEL OR NON WOVEN STRL DISP B (DISPOSABLE) ×2 IMPLANT

## 2021-11-27 NOTE — Progress Notes (Signed)
Patient ID: Misty Moran, female   DOB: 1966/01/16, 56 y.o.   MRN: [id number]   Pre Procedure note for inpatients:   Misty Moran has been scheduled for Procedure(s): HERNIA REPAIR UMBILICAL ADULT WITH POSSIBLE MESH (N/A) today. The various methods of treatment have been discussed with the patient. After consideration of the risks, benefits and treatment options the patient has consented to the planned procedure. I discussed the risks which include but are not limited to bleeding, infection, use of mesh, injury to surrounding structures, the need for further procedures, hernia recurrence, cardiopulmonary issues, post op recovery, etc.  She agrees to proceed.  The patient has been seen and labs reviewed. There are no changes in the patient's condition to prevent proceeding with the planned procedure today.  Recent labs:  Lab Results  Component Value Date   WBC 10.4 11/27/2021   HGB 14.3 11/27/2021   HCT 42.7 11/27/2021   PLT 277 11/27/2021   GLUCOSE 124 (H) 11/27/2021   CHOL 221 (H) 06/20/2019   TRIG 67.0 06/20/2019   HDL 52.20 06/20/2019   LDLCALC 155 (H) 06/20/2019   ALT 30 11/27/2021   AST 26 11/27/2021   NA 140 11/27/2021   K 3.8 11/27/2021   CL 104 11/27/2021   CREATININE 0.78 11/27/2021   BUN 10 11/27/2021   CO2 24 11/27/2021   TSH 2.17 06/20/2019   HGBA1C 5.7 06/20/2019   MICROALBUR 3.6 (H) 06/20/2019    Coralie Keens, MD 11/27/2021 7:46 AM

## 2021-11-27 NOTE — Anesthesia Procedure Notes (Addendum)
Procedure Name: Intubation Date/Time: 11/27/2021 9:34 AM  Performed by: West Pugh, CRNAPre-anesthesia Checklist: Patient identified, Emergency Drugs available, Suction available, Patient being monitored and Timeout performed Patient Re-evaluated:Patient Re-evaluated prior to induction Oxygen Delivery Method: Circle system utilized Preoxygenation: Pre-oxygenation with 100% oxygen Induction Type: IV induction, Rapid sequence and Cricoid Pressure applied Laryngoscope Size: Mac and 4 Grade View: Grade I Tube type: Oral Tube size: 7.5 mm Number of attempts: 1 Airway Equipment and Method: Stylet Placement Confirmation: ETT inserted through vocal cords under direct vision, positive ETCO2, CO2 detector and breath sounds checked- equal and bilateral Secured at: 21 cm Tube secured with: Tape Dental Injury: Teeth and Oropharynx as per pre-operative assessment

## 2021-11-27 NOTE — Progress Notes (Signed)
Called multiple numbers to update family on Misty Moran and was unable to get in touch with anyone.  Left a message on daughters cell phone.

## 2021-11-27 NOTE — ED Notes (Signed)
Pt appears to be sleeping, observe even RR and unlabored, NAD noted, side rails up x2 for safety, plan of care ongoing, call light within reach, no further concerns as of present.   

## 2021-11-27 NOTE — ED Notes (Signed)
Pt up and ad lib, steady gait to BR, NAD noted.

## 2021-11-27 NOTE — Transfer of Care (Signed)
Immediate Anesthesia Transfer of Care Note  Patient: Misty Moran  Procedure(s) Performed: HERNIA REPAIR UMBILICAL ADULT WITH MESH  Patient Location: PACU  Anesthesia Type:General  Level of Consciousness: awake and patient cooperative  Airway & Oxygen Therapy: Patient Spontanous Breathing and Patient connected to face mask oxygen  Post-op Assessment: Report given to RN and Post -op Vital signs reviewed and stable  Post vital signs: Reviewed and stable  Last Vitals:  Vitals Value Taken Time  BP 149/91 11/27/21 1020  Temp    Pulse 90 11/27/21 1023  Resp 15 11/27/21 1023  SpO2 98 % 11/27/21 1023  Vitals shown include unvalidated device data.  Last Pain:  Vitals:   11/27/21 0902  TempSrc: Oral  PainSc: 0-No pain         Complications: No notable events documented.

## 2021-11-27 NOTE — Discharge Instructions (Addendum)
[facility] _______[facility]  UMBILICAL OR INGUINAL HERNIA REPAIR: POST OP INSTRUCTIONS  Always review your discharge instruction sheet given to you by the facility where your surgery was performed. IF YOU HAVE DISABILITY OR FAMILY LEAVE FORMS, YOU MUST BRING THEM TO THE OFFICE FOR PROCESSING.   DO NOT GIVE THEM TO YOUR DOCTOR.  1. A  prescription for pain medication may be given to you upon discharge.  Take your pain medication as prescribed, if needed.  If narcotic pain medicine is not needed, then you may take acetaminophen (Tylenol) or ibuprofen (Advil) as needed. 2. Take your usually prescribed medications unless otherwise directed. If you need a refill on your pain medication, please contact your pharmacy.  They will contact our office to request authorization. Prescriptions will not be filled after 5 pm or on week-ends. 3. You should follow a light diet the first 24 hours after arrival home, such as soup and crackers, etc.  Be sure to include lots of fluids daily.  Resume your normal diet the day after surgery. 4.Most patients will experience some swelling and bruising around the umbilicus or in the groin and scrotum.  Ice packs and reclining will help.  Swelling and bruising can take several days to resolve.  6. It is common to experience some constipation if taking pain medication after surgery.  Increasing fluid intake and taking a stool softener (such as Colace) will usually help or prevent this problem from occurring.  A mild laxative (Milk of Magnesia or Miralax) should be taken according to package directions if there are no bowel movements after 48 hours. 7. Unless discharge instructions indicate otherwise, you may remove your bandages 24-48 hours after surgery, and you may shower at that time.  You may have steri-strips (small skin tapes) in place directly over the incision.  These strips should be left on the skin for 7-10 days.  If your surgeon used skin glue on the  incision, you may shower in 24 hours.  The glue will flake off over the next 2-3 weeks.  Any sutures or staples will be removed at the office during your follow-up visit. 8. ACTIVITIES:  You may resume regular (light) daily activities beginning the next day--such as daily self-care, walking, climbing stairs--gradually increasing activities as tolerated.  You may have sexual intercourse when it is comfortable.  Refrain from any heavy lifting or straining until approved by your doctor - nothing greater than 15 pounds for 6 weeks from surgery  a.You may drive when you are no longer taking prescription pain medication, you can comfortably wear a seatbelt, and you can safely maneuver your car and apply brakes. b.RETURN TO WORK:   _____________________________________________  9.You should see your doctor in the office for a follow-up appointment approximately 2-3 weeks after your surgery.  Make sure that you call for this appointment within a day or two after you arrive home to insure a convenient appointment time. 10.OTHER INSTRUCTIONS: _________________________    _____________________________________  WHEN TO CALL YOUR DOCTOR: Fever over 101.0 Inability to urinate Nausea and/or vomiting Extreme swelling or bruising Continued bleeding from incision. Increased pain, redness, or drainage from the incision  The clinic staff is available to answer your questions during regular business hours.  Please don't hesitate to call and ask to speak to one of the nurses for clinical concerns.  If you have a medical emergency, go to the nearest emergency room or call 911.  A surgeon from [facility] is always on call  at the hospital   [address], [place], [place], [place]  [address] ?  [address], [place], [place]   [address] [phone] ? [phone] ? FAX [phone] Web site: [email or web address]

## 2021-11-27 NOTE — Op Note (Signed)
   Misty Moran 11/26/2021 - 11/27/2021   Pre-op Diagnosis: INCARCERATED UMBILICAL HERNIA     Post-op Diagnosis: SAME  Procedure(s): HERNIA REPAIR UMBILICAL ADULT WITH MESH 2 CM FASCIAL DEFECT  Surgeon(s): Coralie Keens, MD  Anesthesia: Choice  Staff:  Circulator: Reeves Dam, RN Scrub Person: Kaleen Odea RN First Assistant: Alena Bills, RN  Estimated Blood Loss: Minimal  Indications: This is a 56 year old female who presented with an incarcerated hernia just to the right of the umbilicus.  She has had multiple surgical procedures in the past reports that hernias been present for many years but only recently became incarcerated.  The decision was made to proceed to the operating room for repair  Findings: The patient was found to have a 2 cm fascial defect.  It was repaired with a 6.4 cm round ventral light Prolene patch from [facility].   There was no direct evidence of compromised bowel  Procedure: The patient was brought to operating i and identified as the correct patient.  She was placed supine on the operating table and general anesthesia was induced.  Her abdomen was prepped and draped in the usual sterile fashion.  Because of her large midline incision, I decided to make an transverse incision over the palpable hernia that was to the right and just below the umbilicus.  I take the incision down to the hernia sac with the cautery.  At induction of anesthesia, the bowel had already reduced back into the abdominal cavity.  I remove the hernia sac and the fascial defect was only 2 cm in size.  There were minimal adhesions to the fascial edges which I took down with the cautery.  There was no evidence of compromised bowel.  Next a 6.4 cm round ventral Prolene patch from [facility] was brought onto the field.  I placed it through the fascial opening and then pulled the up against the peritoneum with a stay ties.  The mesh was then sewn in place circumferentially with #1  Novafil sutures.  I then cut the ties and closed the fascia over the top of the mesh with a figure-of-eight #1 Novafil suture.  Wide coverage of the fascial defect and good closure the fascia appeared to be achieved.  I anesthetized the fascia and skin with Marcaine.  We irrigated the wound with saline.  The subcutaneous tissue was then closed with interrupted 3-0 Vicryl sutures and the skin was closed with a running 4-0 Monocryl.  Dermabond was then applied.  The patient tolerated the procedure well.  All the counts were correct at the end of the procedure.  The patient was then extubated in the operating room and taken in a stable condition to the recovery room.            Coralie Keens   Date: 11/27/2021  Time: 10:02 AM

## 2021-11-27 NOTE — ED Notes (Signed)
Pt resting and watching TV, NAD noted, observed even RR and unlabored, side rails up x2 for safety, plan of care ongoing, pt expresses no needs or concerns at this time, call light within reach, no further concerns as of present.  

## 2021-11-27 NOTE — Anesthesia Postprocedure Evaluation (Signed)
Anesthesia Post Note  Patient: Misty Moran  Procedure(s) Performed: HERNIA REPAIR UMBILICAL ADULT WITH MESH     Patient location during evaluation: PACU Anesthesia Type: General Level of consciousness: awake and alert Pain management: pain level controlled Vital Signs Assessment: post-procedure vital signs reviewed and stable Respiratory status: spontaneous breathing, nonlabored ventilation, respiratory function stable and patient connected to nasal cannula oxygen Cardiovascular status: blood pressure returned to baseline and stable Postop Assessment: no apparent nausea or vomiting Anesthetic complications: no   No notable events documented.  Last Vitals:  Vitals:   11/27/21 1119 11/27/21 1222  BP: (!) 139/92 (!) 160/99  Pulse: 76 73  Resp: 16 17  Temp: 36.7 C 36.5 C  SpO2: 99% 99%    Last Pain:  Vitals:   11/27/21 1222  TempSrc: Oral  PainSc:                  Tawni Melkonian,W. EDMOND

## 2021-11-27 NOTE — Anesthesia Preprocedure Evaluation (Addendum)
Anesthesia Evaluation  Patient identified by MRN, date of birth, ID band Patient awake    Reviewed: Allergy & Precautions, H&P , NPO status , Patient's Chart, lab work & pertinent test results  Airway Mallampati: II  TM Distance: >3 FB Neck ROM: Full    Dental no notable dental hx. (+) Teeth Intact, Dental Advisory Given   Pulmonary neg pulmonary ROS, former smoker,    Pulmonary exam normal breath sounds clear to auscultation       Cardiovascular negative cardio ROS   Rhythm:Regular Rate:Normal     Neuro/Psych negative neurological ROS  negative psych ROS   GI/Hepatic negative GI ROS, Neg liver ROS,   Endo/Other  Morbid obesity  Renal/GU negative Renal ROS  negative genitourinary   Musculoskeletal  (+) Arthritis , Osteoarthritis,    Abdominal   Peds  Hematology  (+) Blood dyscrasia, anemia ,   Anesthesia Other Findings   Reproductive/Obstetrics negative OB ROS                            Anesthesia Physical Anesthesia Plan  ASA: 3  Anesthesia Plan: General   Post-op Pain Management: Ofirmev IV (intra-op)* and Toradol IV (intra-op)*   Induction: Intravenous  PONV Risk Score and Plan: 4 or greater and Ondansetron, Dexamethasone and Midazolam  Airway Management Planned: Oral ETT  Additional Equipment:   Intra-op Plan:   Post-operative Plan: Extubation in OR  Informed Consent: I have reviewed the patients History and Physical, chart, labs and discussed the procedure including the risks, benefits and alternatives for the proposed anesthesia with the patient or authorized representative who has indicated his/her understanding and acceptance.     Dental advisory given  Plan Discussed with: CRNA  Anesthesia Plan Comments:        Anesthesia Quick Evaluation

## 2021-11-28 ENCOUNTER — Encounter (HOSPITAL_COMMUNITY): Payer: Self-pay | Admitting: Surgery

## 2021-11-28 MED ORDER — ACETAMINOPHEN 500 MG PO TABS: 1000.0000 mg | ORAL_TABLET | Freq: Four times a day (QID) | ORAL | Status: AC

## 2021-11-28 MED ORDER — TRAMADOL HCL 50 MG PO TABS
50.0000 mg | ORAL_TABLET | Freq: Four times a day (QID) | ORAL | 0 refills | Status: AC | PRN
Start: 2021-11-28 — End: 2021-12-03

## 2021-11-28 MED ORDER — METHOCARBAMOL 500 MG PO TABS: 500.0000 mg | ORAL_TABLET | Freq: Four times a day (QID) | ORAL | 0 refills | Status: AC | PRN

## 2021-11-28 NOTE — Progress Notes (Signed)
Discharge instructions given to patient and all questions were answered.  

## 2021-11-28 NOTE — Discharge Summary (Signed)
[facility] Discharge Summary   Patient ID: Misty Moran MRN: [id number] DOB/AGE: 58/24/1967 58 y.o.  Admit date: 11/26/2021 Discharge date: 11/28/2021  Admitting Diagnosis: Incarcerated ventral hernia [K43.6] SBO (small bowel obstruction) ([place]) [K56.609] Incarcerated umbilical hernia [H41.7] Incarcerated hernia [K46.0]   Discharge Diagnosis Incarcerated ventral hernia [K43.6] SBO (small bowel obstruction) ([place]) [K56.609] Incarcerated umbilical hernia [E08.1] Incarcerated hernia [K46.0] S/p HERNIA REPAIR UMBILICAL ADULT WITH MESH  Consultants None  Imaging: CT ABDOMEN PELVIS W CONTRAST  Result Date: 11/26/2021 CLINICAL DATA:  Complicated hernia. EXAM: CT ABDOMEN AND PELVIS WITH CONTRAST TECHNIQUE: Multidetector CT imaging of the abdomen and pelvis was performed using the standard protocol following bolus administration of intravenous contrast. RADIATION DOSE REDUCTION: This exam was performed according to the departmental dose-optimization program which includes automated exposure control, adjustment of the mA and/or kV according to patient size and/or use of iterative reconstruction technique. CONTRAST:  155m OMNIPAQUE IOHEXOL 300 MG/ML  SOLN COMPARISON:  CT examination dated Aug 18, 2012. FINDINGS: Lower chest: No acute abnormality. Hepatobiliary: No focal liver abnormality is seen. No gallstones, gallbladder wall thickening, or biliary dilatation. Pancreas: Unremarkable. No pancreatic ductal dilatation or surrounding inflammatory changes. Spleen: Normal in size without focal abnormality. Adrenals/Urinary Tract: Adrenal glands are unremarkable. Kidneys are normal, without renal calculi, focal lesion, or hydronephrosis. Bladder is unremarkable. Stomach/Bowel: Stomach is within normal limits. Appendix appears normal. There is a bowel loop containing right paraumbilical anterior abdominal hernia with a transition point inside the hernial sac. There are dilated proximal small  bowel loops measuring up to 3.3 cm consistent with bowel obstruction. No extraluminal free air or abdominal collection/abscess. Distal small-bowel loops and colonic loops are collapsed. The findings are consistent with high-grade bowel obstruction. Vascular/Lymphatic: No significant vascular findings are present. No enlarged abdominal or pelvic lymph nodes. Reproductive: Uterus and bilateral adnexa are unremarkable. Other: Right paraumbilical anterior abdominal wall hernia containing bowel loops with a transition point inside the hernial sac. 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 No acute osseous abnormality. IMPRESSION: 1. Bowel loop containing right paraumbilical hernia with high-grade bowel obstruction. Dilated proximal measuring up to 3.3 cm. Surgical consultation for further management is recommended. 2. No extraluminal free air. 3. Status post cholecystectomy. Above findings were reported to Dr. GRegenia Skeeterover the telephone at approximately 9:12 p.m. on 11/26/2021. Electronically Signed   By: IKeane PoliceD.O.   On: 11/26/2021 21:15    Procedures Dr. BNinfa Linden(11/27/21) - [facility]  56[facility].  Workup showed small bowel obstruction due to incisional hernia with partial incarceration without strangulation.  Patient was admitted and underwent procedure listed above.  Tolerated procedure well and was transferred to the floor.  Diet was advanced as tolerated.  On POD1, the patient was voiding well, tolerating diet, ambulating well, pain well controlled, vital signs stable, incisions c/d/i and felt stable for discharge home with an abdominal binder.  Patient will follow up in our office in 3 weeks and knows to call with questions or concerns.    Physical Exam: General:  Alert, NAD, pleasant, comfortable Abd:  Soft, ND, mild tenderness, incision C/D/I with  surgical glue  I or a member of my team have reviewed this patient in the Controlled Substance Database.   Allergies as of 11/28/2021       Reactions   Ciprofloxacin Hcl Anaphylaxis   Peanut-containing Drug Products Anaphylaxis  Percocet [oxycodone-acetaminophen] Anaphylaxis, Itching        Medication List     TAKE these medications    acetaminophen 500 MG tablet Commonly known as: TYLENOL Take 2 tablets (1,000 mg total) by mouth every 6 (six) hours for 3 days.   amLODipine 5 MG tablet Commonly known as: NORVASC TAKE ONE TABLET BY MOUTH ONE TIME DAILY   BIOTIN PO Take 600 mcg by mouth daily.   ferrous sulfate 325 (65 FE) MG tablet TAKE 1 TABLET BY MOUTH WITH BREAKFAST   methocarbamol 500 MG tablet Commonly known as: ROBAXIN Take 1 tablet (500 mg total) by mouth every 6 (six) hours as needed for up to 5 days for muscle spasms (abdominal pain).   traMADol 50 MG tablet Commonly known as: ULTRAM Take 1 tablet (50 mg total) by mouth every 6 (six) hours as needed for up to 5 days (mild pain).   VITAMIN D PO Take 2,000 Units by mouth daily.          Follow-up Information     Maczis, Carlena Hurl, [place]. Go on 12/18/2021.   Specialty: General Surgery Why: Your appointment is 12/18/21 at 2:30 pm  Arrive 77mn early to check in, fill out paperwork, Bring photo ID and insurance information Contact information: [phone]                Signed: MCaroll RancherCMemorial Herm[facility]Sur[facility] 11/28/2021, 9:41 AM Please see Amion for pager number during day hours 7:00am-4:30pm

## 2022-07-28 ENCOUNTER — Encounter: Payer: Self-pay | Admitting: Internal Medicine

## 2023-04-05 ENCOUNTER — Telehealth: Payer: Self-pay | Admitting: Internal Medicine

## 2023-04-05 NOTE — Telephone Encounter (Signed)
 Called pt to sch annual physical, left vm. Pls sch physical appt for pt.

## 2023-04-14 NOTE — Progress Notes (Signed)
Chief Complaint  Patient presents with   Annual Exam    Pt reports she had stopped taking Biotin on Sunday to prevent it from interferring with blood work.     HPI: Patient  Misty Moran  58 y.o. comes in today for Preventive Health Care visit  And multiple issues  BP taking amlodipine mostly . TIneds updated  parameters  pap immuniz colon screen .  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  NO recent gyne check ? 2016  last few years  periods only once a year last for about 5 days and normal ( had hx of profuse bleeding)   Health Maintenance  Topic Date Due   Hepatitis C Screening  Never done   MAMMOGRAM  01/28/2014   Cervical Cancer Screening (HPV/Pap Cotest)  09/08/2015   Colonoscopy  09/15/2022   COVID-19 Vaccine (3 - Pfizer risk series) 05/01/2023 (Originally 08/12/2019)   INFLUENZA VACCINE  06/28/2023 (Originally 10/29/2022)   Zoster Vaccines- Shingrix (2 of 2) 06/10/2023   DTaP/Tdap/Td (4 - Td or Tdap) 04/14/2033   HIV Screening  Completed   HPV VACCINES  Aged Out   Health Maintenance Review LIFESTYLE:  Exercise:   none Tobacco/ETS: no Alcohol: n Sugar beverages: no  Sleep: not enough 7 Drug use: no HH of 2  mom and her  3 cats and a dog.  Work:  remote  and  at desk  8 x 5 days  Perioid once a year now  last  4-5 day like normal   Before covid  had last heavy periods  2021    ROS:  GEN/ HEENT: No fever,  sweats headaches vision problems hearing changes, CV/ PULM; No chest pain shortness of breath cough, syncope,edema  change in exercise tolerance. May be getting  early  slee arway since gained wieght cause can feel not breathing right and wake up  some sharp cp left upp at times no assoc sx and no sob radiation  GI /GU: No adominal pain, vomiting, change in bowel habits. No blood in the stool.  Stress incontinence at times  SKIN/HEME: ,no acute skin rashes suspicious lesions or bleeding. No lymphadenopathy, nodules, masses.  NEURO/ PSYCH:  No neurologic signs such as weakness numbness. No depression anxiety. IMM/ Allergy: No unusual infections.  Allergy .   REST of 12 system review negative except as per HPI  Asks option about being a kidney donor or other cause of needs  her x was on dialysis    Past Medical History:  Diagnosis Date   Anemia    ? from periods on iron a long time   Diverticulosis    5-6 years ago   Lesion of right native kidney    MRI right Bosniak type 3 under eval to have surgery to remove   Swelling of right lower extremity hx of none currently   Thyroid cancer (HCC)    Thyroid disease    left side enlarged, biopsy done by dr Pollyann Kennedy 3 weeks ago, results normal   Transfusion history 2014   with partial bowel resection    Past Surgical History:  Procedure Laterality Date   BIOPSY THYROID Left 2014   Medial benign follicular lesion left   BREAST REDUCTION SURGERY Bilateral 2012   CESAREAN SECTION  1987 and 2002  CHOLECYSTECTOMY  1987   COSMETIC SURGERY     tummy tuck breast reduction  lipossuction   PARTIAL COLECTOMY N/A 11/18/2012   Procedure: OPEN SIGMOID COLECTOMY, splenic flexure mobilization, rigid protoscope;  Surgeon: Ernestene Mention, MD;  Location: [facility];  Service: General;  Laterality: N/A;   removal of renal lesion benign   2011   THYROIDECTOMY Right 1999   thyroid cancer   tummy tuck  2012   UMBILICAL HERNIA REPAIR N/A 11/27/2021   Procedure: HERNIA REPAIR UMBILICAL ADULT WITH MESH;  Surgeon: Abigail Miyamoto, MD;  Location: [facility];  Service: General;  Laterality: N/A;    Family History  Problem Relation Age of Onset   Hypertension Mother    Stroke Maternal Grandmother    Breast cancer Maternal Grandmother        great   Crohn's disease Maternal Grandmother        colitis   Colon cancer Maternal Uncle        great   Crohn's  disease Maternal Aunt        colitis, diverticulosis   Protein S deficiency Maternal Aunt     Social History   Socioeconomic History   Marital status: Divorced    Spouse name: Not on file   Number of children: 2   Years of education: Not on file   Highest education level: 12th grade  Occupational History   Occupation: self employed  Tobacco Use   Smoking status: Former    Current packs/day: 0.00    Average packs/day: 1 pack/day for 9.0 years (9.0 ttl pk-yrs)    Types: Cigarettes    Start date: 03/30/1980    Quit date: 03/30/1989    Years since quitting: 34.0   Smokeless tobacco: Never  Vaping Use   Vaping status: Never Used  Substance and Sexual Activity   Alcohol use: Yes    Comment: rare   Drug use: No   Sexual activity: Not on file  Other Topics Concern   Not on file  Social History Narrative   Divorced   Right handed      Child at home    [place] of 3   Regular exercise   G3p2   Some caretaking  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       Social Drivers of Corporate investment banker Strain: Low Risk  (04/15/2023)   Overall Financial Resource Strain (CARDIA)    Difficulty of Paying Living Expenses: Not very hard  Food Insecurity: No Food Insecurity (04/15/2023)   Hunger Vital Sign    Worried About Running Out of Food in the Last Year: Never true    Ran Out of Food in the Last Year: Never true  Transportation Needs: No Transportation Needs (04/15/2023)   PRAPARE - Administrator, Civil Service (Medical): No    Lack of Transportation (Non-Medical): No  Physical Activity: Unknown (04/15/2023)   Exercise Vital Sign    Days of Exercise per Week: 0 days    Minutes of Exercise per Session: Not on file  Stress: Stress Concern Present (04/15/2023)   [employer] of Occupational Health - Occupational Stress Questionnaire    Feeling of Stress : Very much  Social Connections: Socially Isolated (04/15/2023)   Social Connection and Isolation  Panel [NHANES]    Frequency of Communication with Friends and Family: More than three times a week    Frequency of Social Gatherings with  Friends and Family: Once a week    Attends Religious Services: Never    Database administrator or Organizations: No    Attends Engineer, structural: Not on file    Marital Status: Divorced    Outpatient Medications Prior to Visit  Medication Sig Dispense Refill   amLODipine (NORVASC) 5 MG tablet TAKE ONE TABLET BY MOUTH ONE TIME DAILY 30 tablet 0   Cholecalciferol (VITAMIN D PO) Take 2,000 Units by mouth daily.     ferrous sulfate 325 (65 FE) MG tablet TAKE 1 TABLET BY MOUTH WITH BREAKFAST 30 tablet 5   selenium 200 MCG TABS tablet 200 mcg daily.     BIOTIN PO Take 600 mcg by mouth daily. (Patient not taking: Reported on 04/15/2023)     No facility-administered medications prior to visit.     EXAM:  BP 110/88 (BP Location: Left Arm, Patient Position: Sitting, Cuff Size: Large)   Pulse 80   Temp 97.8 F (36.6 C) (Oral)   Ht 5\' 7"  (1.702 m)   Wt 297 lb 9.6 oz (135 kg)   LMP 05/29/2022 (Approximate)   SpO2 96%   BMI 46.61 kg/m   Body mass index is 46.61 kg/m. Wt Readings from Last 3 Encounters:  04/15/23 297 lb 9.6 oz (135 kg)  11/27/21 264 lb 8.8 oz (120 kg)  12/26/20 266 lb (120.7 kg)    Physical Exam: Vital signs reviewed RUE:AVWU is a well-developed well-nourished alert cooperative    who appearsr stated age in no acute distress.  HEENT: normocephalic atraumatic , Eyes: PERRL EOM's full, conjunctiva clear, Nares: paten,t no deformity discharge or tenderness., Ears: no deformity EAC's clear TMs with normal landmarks. Mouth: clear OP, no lesions, edema.  Moist mucous membranes. Dentition in adequate repair. NECK: supple without masses, thyromegaly or bruits. CHEST/PULM:  Clear to auscultation and percussion breath sounds equal no wheeze , rales or rhonchi. No chest wall deformities or tenderness. Breast: normal by inspection .  No dimpling, discharge, masses, tenderness or discharge . Well healed surg scar  no lesion   no cp tenderness but points to left  t3-4 cc area   CV: PMI is nondisplaced, S1 S2 no gallops, murmurs, rubs. Peripheral pulses are present  without delay.No JVD .  ABDOMEN: Bowel sounds normal nontender  No guard or rebound, no hepato splenomegal no CVA tenderness.  Extremtities:  No clubbing cyanosis or edema, no acute joint swelling or redness no focal atrophy NEURO:  Oriented x3, cranial nerves 3-12 appear to be intact, no obvious focal weakness,gait within normal limits no abnormal reflexes or asymmetrical SKIN: No acute rashes normal turgor, color, no bruising or petechiae. PSYCH: Oriented, good eye contact, looks somewhat down but nl verbal and eye contact cognition and judgment appear normal. LN: no cervical axillary adenopathy Pelvic: NL ext GU, labia clear without lesions or rash . Vagina no lesions .Cervix: hard to view post non tender and no masses  UTERUS: Neg CMT Adnexa:  clear no masses .large abd wall   limit efficiency of exam PAP done w hpv    Lab Results  Component Value Date   WBC 10.4 11/27/2021   HGB 14.3 11/27/2021   HCT 42.7 11/27/2021   PLT 277 11/27/2021   GLUCOSE 124 (H) 11/27/2021   CHOL 221 (H) 06/20/2019   TRIG 67.0 06/20/2019   HDL 52.20 06/20/2019   LDLCALC 155 (H) 06/20/2019   ALT 30 11/27/2021   AST 26 11/27/2021   NA 140 11/27/2021  K 3.8 11/27/2021   CL 104 11/27/2021   CREATININE 0.78 11/27/2021   BUN 10 11/27/2021   CO2 24 11/27/2021   TSH 2.17 06/20/2019   HGBA1C 5.7 06/20/2019   MICROALBUR 3.6 (H) 06/20/2019    BP Readings from Last 3 Encounters:  04/15/23 110/88  11/28/21 (!) 159/96  12/26/20 (!) 145/80    Lab plan reviewed with patient   ASSESSMENT AND PLAN:  Discussed the following assessment and plan:    ICD-10-CM   1. Visit for preventive health examination  Z00.00 CBC with Differential/Platelet    Comprehensive metabolic panel     Lipid panel    TSH    Hemoglobin A1c    Hep C Antibody    2. Medication management  Z79.899     3. Morbid obesity (HCC)  E66.01     4. Chest pain, unspecified type  R07.9    dosn sound cardiac but ekg today    5. Mixed hyperlipidemia  E78.2     6. Elevated blood pressure reading  R03.0     7. Need for tetanus booster  Z23 Tdap vaccine greater than or equal to 7yo IM    8. Need for shingles vaccine  Z23 Zoster Recombinant (Shingrix )    9. Encounter for screening for malignant neoplasm of breast, unspecified screening modality  Z12.39 MM 3D SCREENING MAMMOGRAM BILATERAL BREAST    10. Screening for cervical cancer  Z12.4 Cytology - PAP    11. Screening for colon cancer  Z12.11 Cologuard    12. Other fatigue  R53.83 Vitamin B12    13. Need for hepatitis C screening test  Z11.59 Hep C Antibody    14. History of bowel resection  Z90.49 Vitamin B12    15. Depressive syndrome  F32.A     Multiple issues  and lack of primary prevention. Recnetly had economic downslide and loss . Advise counseling  and work on faloss realtionships and cbt maybe   Concern for health Assoc with obestity and weight gain . Poss  ealry sx of osa with weigh gain.   Life events have been a difficulty to adding to health risk . Disc and plan fu  Fatigue multifactorial.  Mood age poss early osa  with weight gain check for anemia  etc  Continue amlodipine 5 mg  as bp is controlled  Plan fu  in  2-3 mos or as indicated Last  colon 10 year ave risk   will order cologuard  EKG not totally nl flattened t waves  will check baseline  seek care if alarm sx  Return in about 2 months (around 06/13/2023) for depending on results.  Patient Care Team: Jasalyn Frysinger, Neta Mends, MD as PCP - General Serena Colonel, MD as Consulting Physician (Otolaryngology) Claud Kelp, MD as Consulting Physician (General Surgery) Kathreen Cosier, MD as Consulting Physician (Obstetrics and Gynecology) Patient Instructions  Advise  counseling  related to depression reaction and   relationship with family. And loss and cognitive therapy   Positive self talk. Track what you do  to be  able to change. Understand travel though loss  as we discussed .  Lab today  And plan fu   Will do further record.  To decide on colon screening .  ? Cologuard  I don't think the chest sx are  cardiac but plan fu. Here      Neta Mends. Latori Beggs M.D.

## 2023-04-15 ENCOUNTER — Ambulatory Visit (INDEPENDENT_AMBULATORY_CARE_PROVIDER_SITE_OTHER): Payer: BC Managed Care – PPO | Admitting: Internal Medicine

## 2023-04-15 ENCOUNTER — Other Ambulatory Visit (HOSPITAL_COMMUNITY)
Admission: RE | Admit: 2023-04-15 | Discharge: 2023-04-15 | Disposition: A | Discharge status: Home / Self Care | Payer: BC Managed Care – PPO | Source: Ambulatory Visit | Attending: Internal Medicine | Admitting: Internal Medicine

## 2023-04-15 VITALS — BP 110/88 | HR 80 | Temp 97.8°F | Ht 67.0 in | Wt 297.6 lb

## 2023-04-15 DIAGNOSIS — Z79899 Other long term (current) drug therapy: Secondary | ICD-10-CM

## 2023-04-15 DIAGNOSIS — Z1239 Encounter for other screening for malignant neoplasm of breast: Secondary | ICD-10-CM

## 2023-04-15 DIAGNOSIS — Z1159 Encounter for screening for other viral diseases: Secondary | ICD-10-CM

## 2023-04-15 DIAGNOSIS — R030 Elevated blood-pressure reading, without diagnosis of hypertension: Secondary | ICD-10-CM

## 2023-04-15 DIAGNOSIS — E782 Mixed hyperlipidemia: Secondary | ICD-10-CM

## 2023-04-15 DIAGNOSIS — R5383 Other fatigue: Secondary | ICD-10-CM

## 2023-04-15 DIAGNOSIS — Z9049 Acquired absence of other specified parts of digestive tract: Secondary | ICD-10-CM | POA: Diagnosis not present

## 2023-04-15 DIAGNOSIS — Z23 Encounter for immunization: Secondary | ICD-10-CM | POA: Diagnosis not present

## 2023-04-15 DIAGNOSIS — R079 Chest pain, unspecified: Secondary | ICD-10-CM

## 2023-04-15 DIAGNOSIS — Z0000 Encounter for general adult medical examination without abnormal findings: Secondary | ICD-10-CM

## 2023-04-15 DIAGNOSIS — Z124 Encounter for screening for malignant neoplasm of cervix: Secondary | ICD-10-CM | POA: Insufficient documentation

## 2023-04-15 DIAGNOSIS — Z1211 Encounter for screening for malignant neoplasm of colon: Secondary | ICD-10-CM

## 2023-04-15 DIAGNOSIS — F32A Depression, unspecified: Secondary | ICD-10-CM

## 2023-04-15 LAB — COMPREHENSIVE METABOLIC PANEL
ALT: 18 U/L (ref 0–35)
AST: 13 U/L (ref 0–37)
Albumin: 4.5 g/dL (ref 3.5–5.2)
Alkaline Phosphatase: 79 U/L (ref 39–117)
BUN: 18 mg/dL (ref 6–23)
CO2: 27 meq/L (ref 19–32)
Calcium: 9.7 mg/dL (ref 8.4–10.5)
Chloride: 103 meq/L (ref 96–112)
Creatinine, Ser: 0.7 mg/dL (ref 0.40–1.20)
GFR: 94.81 mL/min (ref >60.00)
Glucose, Bld: 84 mg/dL (ref 70–99)
Potassium: 3.9 meq/L (ref 3.5–5.1)
Sodium: 140 meq/L (ref 135–145)
Total Bilirubin: 0.5 mg/dL (ref 0.2–1.2)
Total Protein: 7.6 g/dL (ref 6.0–8.3)

## 2023-04-15 LAB — CBC WITH DIFFERENTIAL/PLATELET
Basophils Absolute: 0 10*3/uL (ref 0.0–0.1)
Basophils Relative: 0.5 % (ref 0.0–3.0)
Eosinophils Absolute: 0.1 10*3/uL (ref 0.0–0.7)
Eosinophils Relative: 1.4 % (ref 0.0–5.0)
HCT: 41.1 % (ref 36.0–46.0)
Hemoglobin: 13.7 g/dL (ref 12.0–15.0)
Lymphocytes Relative: 19.5 % (ref 12.0–46.0)
Lymphs Abs: 1.3 10*3/uL (ref 0.7–4.0)
MCHC: 33.4 g/dL (ref 30.0–36.0)
MCV: 87 fL (ref 78.0–100.0)
Monocytes Absolute: 0.5 10*3/uL (ref 0.1–1.0)
Monocytes Relative: 7 % (ref 3.0–12.0)
Neutro Abs: 4.9 10*3/uL (ref 1.4–7.7)
Neutrophils Relative %: 71.6 % (ref 43.0–77.0)
Platelets: 263 10*3/uL (ref 150.0–400.0)
RBC: 4.72 Mil/uL (ref 3.87–5.11)
RDW: 13.6 % (ref 11.5–15.5)
WBC: 6.8 10*3/uL (ref 4.0–10.5)

## 2023-04-15 LAB — LIPID PANEL
Cholesterol: 250 mg/dL — ABNORMAL HIGH (ref 0–200)
HDL: 54.9 mg/dL (ref >39.00)
LDL Cholesterol: 172 mg/dL — ABNORMAL HIGH (ref 0–99)
NonHDL: 194.79
Total CHOL/HDL Ratio: 5
Triglycerides: 116 mg/dL (ref 0.0–149.0)
VLDL: 23.2 mg/dL (ref 0.0–40.0)

## 2023-04-15 LAB — TSH: TSH: 2.67 u[IU]/mL (ref 0.35–5.50)

## 2023-04-15 LAB — VITAMIN B12: Vitamin B-12: 238 pg/mL (ref 211–911)

## 2023-04-15 LAB — HEMOGLOBIN A1C: Hgb A1c MFr Bld: 6.1 % (ref 4.6–6.5)

## 2023-04-15 NOTE — Patient Instructions (Addendum)
Advise counseling  related to depression reaction and   relationship with family. And loss and cognitive therapy   Positive self talk. Track what you do  to be  able to change. Understand travel though loss  as we discussed .  Lab today  And plan fu   Will do further record.  To decide on colon screening .  ? Cologuard  I don't think the chest sx are  cardiac but plan fu. Here

## 2023-04-16 LAB — CYTOLOGY - PAP
Adequacy: ABSENT
Comment: NEGATIVE
Diagnosis: NEGATIVE
High risk HPV: NEGATIVE

## 2023-04-16 LAB — HEPATITIS C ANTIBODY: Hepatitis C Ab: NONREACTIVE

## 2023-04-17 ENCOUNTER — Encounter: Payer: Self-pay | Admitting: Internal Medicine

## 2023-04-17 ENCOUNTER — Other Ambulatory Visit: Payer: Self-pay | Admitting: Internal Medicine

## 2023-04-17 NOTE — Progress Notes (Signed)
Cholesterol is high    A1c is prediabetic but no diabetes Vit b12 los normal  may benefit from adding b12 supplement 1000 mcg per day  Pap normal  Will will discuss at fu visit

## 2023-04-18 ENCOUNTER — Encounter: Payer: Self-pay | Admitting: Internal Medicine

## 2023-04-19 MED ORDER — AMLODIPINE BESYLATE 5 MG PO TABS: 5.0000 mg | ORAL_TABLET | Freq: Every day | ORAL | 0 refills | Status: DC

## 2023-04-28 NOTE — Addendum Note (Signed)
Addended by: Vickii Chafe on: 04/28/2023 12:37 PM   Modules accepted: Orders

## 2023-05-03 ENCOUNTER — Ambulatory Visit
Admission: RE | Admit: 2023-05-03 | Discharge: 2023-05-03 | Disposition: A | Discharge status: Home / Self Care | Payer: BC Managed Care – PPO | Source: Ambulatory Visit | Attending: Internal Medicine | Admitting: Internal Medicine

## 2023-05-03 DIAGNOSIS — Z1239 Encounter for other screening for malignant neoplasm of breast: Secondary | ICD-10-CM

## 2023-05-03 DIAGNOSIS — Z1211 Encounter for screening for malignant neoplasm of colon: Secondary | ICD-10-CM | POA: Diagnosis not present

## 2023-05-03 DIAGNOSIS — Z1231 Encounter for screening mammogram for malignant neoplasm of breast: Secondary | ICD-10-CM | POA: Diagnosis not present

## 2023-05-08 LAB — COLOGUARD: COLOGUARD: NEGATIVE

## 2023-06-15 ENCOUNTER — Ambulatory Visit (INDEPENDENT_AMBULATORY_CARE_PROVIDER_SITE_OTHER): Payer: BC Managed Care – PPO | Admitting: Internal Medicine

## 2023-06-15 ENCOUNTER — Encounter: Payer: Self-pay | Admitting: Internal Medicine

## 2023-06-15 DIAGNOSIS — Z79899 Other long term (current) drug therapy: Secondary | ICD-10-CM | POA: Diagnosis not present

## 2023-06-15 DIAGNOSIS — Z23 Encounter for immunization: Secondary | ICD-10-CM

## 2023-06-15 DIAGNOSIS — Z6841 Body Mass Index (BMI) 40.0 and over, adult: Secondary | ICD-10-CM

## 2023-06-15 DIAGNOSIS — E782 Mixed hyperlipidemia: Secondary | ICD-10-CM | POA: Diagnosis not present

## 2023-06-15 DIAGNOSIS — I10 Essential (primary) hypertension: Secondary | ICD-10-CM

## 2023-06-15 DIAGNOSIS — E785 Hyperlipidemia, unspecified: Secondary | ICD-10-CM

## 2023-06-15 MED ORDER — PHENTERMINE HCL 37.5 MG PO TABS
37.5000 mg | ORAL_TABLET | Freq: Every day | ORAL | 1 refills | Status: DC
Start: 2023-06-15 — End: 2023-08-31

## 2023-06-15 NOTE — Progress Notes (Signed)
 Chief Complaint  Patient presents with   Medical Management of Chronic Issues    Pt is here for her 2 months f/u and 2nd shingle vaccine. Pt states she needs something to suppress her appetite to lose weight. And ask provider about pneumonia vaccine.     HPI: Misty Moran 58 y.o. come in for Chronic disease management  fu last visit   See above  HT:  Taking amlodipine 2.5 Needs help with appettie suppression  hx of weight loss and  tummy tuck in past   hard on weekends when not working .  Shoinrix 2 asks aobut other vaccines  .   ROS: See pertinent positives and negatives per HPI. No new sx   Past Medical History:  Diagnosis Date   Anemia    ? from periods on iron a long time   Diverticulosis    5-6 years ago   Lesion of right native kidney    MRI right Bosniak type 3 under eval to have surgery to remove   Swelling of right lower extremity hx of none currently   Thyroid cancer (HCC)    Thyroid disease    left side enlarged, biopsy done by dr Pollyann Kennedy 3 weeks ago, results normal   Transfusion history 2014   with partial bowel resection    Family History  Problem Relation Age of Onset   Hypertension Mother    Stroke Maternal Grandmother    Breast cancer Maternal Grandmother        great   Crohn's disease Maternal Grandmother        colitis   Colon cancer Maternal Uncle        great   Crohn's disease Maternal Aunt        colitis, diverticulosis   Protein S deficiency Maternal Aunt     Social History   Socioeconomic History   Marital status: Divorced    Spouse name: Not on file   Number of children: 2   Years of education: Not on file   Highest education level: 12th grade  Occupational History   Occupation: self employed  Tobacco Use   Smoking status: Former    Current packs/day: 0.00    Average packs/day: 1 pack/day for 9.0 years (9.0 ttl pk-yrs)    Types: Cigarettes    Start date: 03/30/1980    Quit date: 03/30/1989    Years since quitting: 34.2    Smokeless tobacco: Never  Vaping Use   Vaping status: Never Used  Substance and Sexual Activity   Alcohol use: Yes    Comment: rare   Drug use: No   Sexual activity: Not on file  Other Topics Concern   Not on file  Social History Narrative   Divorced   Right handed      Child at home    [facility] of 3   Regular exercise   G3p2   Some caretaking  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       Social Drivers of Corporate investment banker Strain: Low Risk  (04/15/2023)   Overall Financial Resource Strain (CARDIA)    Difficulty of Paying Living Expenses: Not very hard  Food Insecurity: No Food Insecurity (04/15/2023)   Hunger Vital Sign    Worried About Running Out of Food in the Last Year: Never true    Ran Out of Food in the Last Year: Never true  Transportation Needs: No Transportation  Needs (04/15/2023)   PRAPARE - Administrator, Civil Service (Medical): No    Lack of Transportation (Non-Medical): No  Physical Activity: Unknown (04/15/2023)   Exercise Vital Sign    Days of Exercise per Week: 0 days    Minutes of Exercise per Session: Not on file  Stress: Stress Concern Present (04/15/2023)   [employer] of Occupational Health - Occupational Stress Questionnaire    Feeling of Stress : Very much  Social Connections: Socially Isolated (04/15/2023)   Social Connection and Isolation Panel [NHANES]    Frequency of Communication with Friends and Family: More than three times a week    Frequency of Social Gatherings with Friends and Family: Once a week    Attends Religious Services: Never    Database administrator or Organizations: No    Attends Engineer, structural: Not on file    Marital Status: Divorced    Outpatient Medications Prior to Visit  Medication Sig Dispense Refill   amLODipine (NORVASC) 5 MG tablet Take 1 tablet (5 mg total) by mouth daily. 90 tablet 0   BIOTIN PO Take 600 mcg by mouth daily.     Cholecalciferol (VITAMIN D PO)  Take 2,000 Units by mouth daily.     ferrous sulfate 325 (65 FE) MG tablet TAKE 1 TABLET BY MOUTH WITH BREAKFAST 30 tablet 5   selenium 200 MCG TABS tablet 200 mcg daily.     No facility-administered medications prior to visit.     EXAM:  BP 128/86 (BP Location: Left Arm, Patient Position: Sitting, Cuff Size: Large)   Pulse 87   Temp 97.9 F (36.6 C) (Oral)   Ht 5' 8.25" (1.734 m)   Wt 297 lb 6.4 oz (134.9 kg)   SpO2 98%   BMI 44.89 kg/m   Body mass index is 44.89 kg/m. Wt Readings from Last 3 Encounters:  06/15/23 297 lb 6.4 oz (134.9 kg)  04/15/23 297 lb 9.6 oz (135 kg)  11/27/21 264 lb 8.8 oz (120 kg)    GENERAL: vitals reviewed and listed above, alert, oriented, appears well hydrated and in no acute distress  MS: moves all extremities without noticeable focal  abnormality PSYCH: pleasant and cooperative, no obvious depression or anxiety Lab Results  Component Value Date   WBC 6.8 04/15/2023   HGB 13.7 04/15/2023   HCT 41.1 04/15/2023   PLT 263.0 04/15/2023   GLUCOSE 84 04/15/2023   CHOL 250 (H) 04/15/2023   TRIG 116.0 04/15/2023   HDL 54.90 04/15/2023   LDLCALC 172 (H) 04/15/2023   ALT 18 04/15/2023   AST 13 04/15/2023   NA 140 04/15/2023   K 3.9 04/15/2023   CL 103 04/15/2023   CREATININE 0.70 04/15/2023   BUN 18 04/15/2023   CO2 27 04/15/2023   TSH 2.67 04/15/2023   HGBA1C 6.1 04/15/2023   MICROALBUR 3.6 (H) 06/20/2019   BP Readings from Last 3 Encounters:  06/15/23 128/86  04/15/23 110/88  11/28/21 (!) 159/96    ASSESSMENT AND PLAN:  Discussed the following assessment and plan:  Morbid obesity (HCC)  Hypertension, unspecified type  Medication management  Mixed hyperlipidemia  Need for shingles vaccine - Plan: Zoster Recombinant (Shingrix )  Hyperlipidemia, unspecified hyperlipidemia type - ldl up to 174 will follow  lsi and healthy weight managment    Ok to try appetite Supressant exp need on weekends  working on getting back on  track Had healthy weight loss in past  but worse after  external factors stress.  Glp1 would be helpful but prob not affordable at this time Plan fu in 2 mos or thereabouts  Bp much better taking med. Disc vaccines   Shingrix today  OK to get prevnar 20 or 21 vaccine but no compelling indication  based on age.  Stress   management     -Patient advised to return or notify health care team  if  new concerns arise.  Patient Instructions  Shingrix today  Prevnar or other if wishes  Continue attention to lifestyle intervention healthy eating and exercise .    Neta Mends. Meshia Rau M.D.

## 2023-06-15 NOTE — Patient Instructions (Signed)
 Shingrix today  Prevnar or other if wishes  Continue attention to lifestyle intervention healthy eating and exercise .

## 2023-07-15 ENCOUNTER — Emergency Department (HOSPITAL_COMMUNITY)
Admission: EM | Admit: 2023-07-15 | Discharge: 2023-07-15 | Disposition: A | Discharge status: Home / Self Care | Attending: Emergency Medicine | Admitting: Emergency Medicine

## 2023-07-15 ENCOUNTER — Other Ambulatory Visit: Payer: Self-pay | Admitting: Internal Medicine

## 2023-07-15 ENCOUNTER — Emergency Department (HOSPITAL_COMMUNITY)

## 2023-07-15 ENCOUNTER — Other Ambulatory Visit: Payer: Self-pay

## 2023-07-15 ENCOUNTER — Encounter (HOSPITAL_COMMUNITY): Payer: Self-pay

## 2023-07-15 DIAGNOSIS — Z91010 Allergy to peanuts: Secondary | ICD-10-CM | POA: Insufficient documentation

## 2023-07-15 DIAGNOSIS — I10 Essential (primary) hypertension: Secondary | ICD-10-CM | POA: Diagnosis not present

## 2023-07-15 DIAGNOSIS — Z79899 Other long term (current) drug therapy: Secondary | ICD-10-CM | POA: Insufficient documentation

## 2023-07-15 DIAGNOSIS — R0789 Other chest pain: Secondary | ICD-10-CM | POA: Diagnosis not present

## 2023-07-15 DIAGNOSIS — E039 Hypothyroidism, unspecified: Secondary | ICD-10-CM | POA: Insufficient documentation

## 2023-07-15 DIAGNOSIS — Z85850 Personal history of malignant neoplasm of thyroid: Secondary | ICD-10-CM | POA: Diagnosis not present

## 2023-07-15 DIAGNOSIS — R079 Chest pain, unspecified: Secondary | ICD-10-CM

## 2023-07-15 HISTORY — DX: Essential (primary) hypertension: I10

## 2023-07-15 LAB — CBC
HCT: 41.9 % (ref 36.0–46.0)
Hemoglobin: 13.5 g/dL (ref 12.0–15.0)
MCH: 28.5 pg (ref 26.0–34.0)
MCHC: 32.2 g/dL (ref 30.0–36.0)
MCV: 88.4 fL (ref 80.0–100.0)
Platelets: 277 10*3/uL (ref 150–400)
RBC: 4.74 MIL/uL (ref 3.87–5.11)
RDW: 13.3 % (ref 11.5–15.5)
WBC: 5.4 10*3/uL (ref 4.0–10.5)
nRBC: 0 % (ref 0.0–0.2)

## 2023-07-15 LAB — BASIC METABOLIC PANEL WITH GFR
Anion gap: 9 (ref 5–15)
BUN: 17 mg/dL (ref 6–20)
CO2: 24 mmol/L (ref 22–32)
Calcium: 9.1 mg/dL (ref 8.9–10.3)
Chloride: 105 mmol/L (ref 98–111)
Creatinine, Ser: 0.66 mg/dL (ref 0.44–1.00)
GFR, Estimated: >60 mL/min (ref >60)
Glucose, Bld: 100 mg/dL — ABNORMAL HIGH (ref 70–99)
Potassium: 3.5 mmol/L (ref 3.5–5.1)
Sodium: 138 mmol/L (ref 135–145)

## 2023-07-15 LAB — TROPONIN I (HIGH SENSITIVITY)
Troponin I (High Sensitivity): 2 ng/L (ref <18)
Troponin I (High Sensitivity): <2 ng/L (ref <18)

## 2023-07-15 LAB — HCG, QUANTITATIVE, PREGNANCY: hCG, Beta Chain, Quant, S: 2 m[IU]/mL (ref <5)

## 2023-07-15 NOTE — Discharge Instructions (Signed)
 You were seen in the emergency department for your chest pain.  Your workup today showed no signs of heart attack or stress on your heart and lungs were normal on your x-ray.  It is unclear what is causing your symptoms at this time but you do have some risk factors for heart disease I have given you a referral to follow-up with cardiology.  They should be calling you to schedule follow-up appointment in the next few days however if you have not heard from them by Tuesday I would call their office to schedule an appointment.  You can take Tylenol as needed for pain.  You should return to the emergency department if your pain gets significantly worse, you have associated shortness of breath, vomiting, sweating or any other new or concerning symptoms.

## 2023-07-15 NOTE — ED Notes (Signed)
Sent down blue top ?

## 2023-07-15 NOTE — ED Triage Notes (Signed)
 Patient feels squeezing to the left side of her chest, radiating up to her left neck and left jaw. Tried standing up after but stated the pain was still there. Took her blood pressure at home it was 177/80. Pain is not radiating anymore. No nausea or vomiting. Her doctor stated her T was depressed on her EKG.

## 2023-07-15 NOTE — ED Provider Triage Note (Signed)
 Emergency Medicine Provider Triage Evaluation Note  Misty Moran , a 58 y.o. female  was evaluated in triage.  Pt complains of chest pain. Developed left sided chest pain going to neck and jaw while at work today. Pain started about an hour ago and has waxed and waned since the onset. No shortness of breath or palpitations.  Denies cardia history.  Review of Systems  Positive: Chest pain Negative: Shortness of breath  Physical Exam  BP (!) 166/94   Pulse 87   Temp 97.8 F (36.6 C) (Oral)   Resp 20   Ht 5\' 8"  (1.727 m)   Wt 130.2 kg   SpO2 99%   BMI 43.64 kg/m  Gen:   Awake, no distress   Resp:  Normal effort  MSK:   Moves extremities without difficulty  Other:    Medical Decision Making  Medically screening exam initiated at 12:36 PM.  Appropriate orders placed.  Airlie L Demmon was informed that the remainder of the evaluation will be completed by another provider, this initial triage assessment does not replace that evaluation, and the importance of remaining in the ED until their evaluation is complete.    Sonnie Dusky, PA-C 07/15/23 1704

## 2023-07-15 NOTE — ED Provider Notes (Signed)
 [place] EMERGENCY DEPARTMENT AT [facility] Provider Note   CSN: [id number] Arrival date & time: 07/15/23  1220     History  Chief Complaint  Patient presents with   Chest Pain    Misty Moran is a 58 y.o. female.  Patient is a 58 year old female with a past medical history of hypertension and prior thyroid cancer status post thyroidectomy in the 90s presenting to the emergency department with chest pain.  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  She states that felt like a pressure type of pain in the left side of her chest.  She states that it radiated up into her neck and felt like's "something was squeezing the artery in her neck".  She states that she tried standing up and stretching with no improvement of the pain which prompted her to come to the emergency department.  States that she did check her blood pressure at home prior to arrival and it was elevated in the 170s systolic.  She denies any associated diaphoresis, nausea, vomiting.  She states that she does get swelling in her legs at the end of the day but no significant swelling.  She denies any associated shortness of breath.  She denies any known cardiac history.  She denies any history of VTE, no recent hospitalization or surgery, any recent long travel in the car or plane, hormone use or active cancer history.  The history is provided by the patient.  Chest Pain      Home Medications Prior to Admission medications   Medication Sig Start Date End Date Taking? Authorizing Provider  amLODipine (NORVASC) 5 MG tablet TAKE ONE TABLET BY MOUTH ONCE A DAY 07/15/23   Panosh, Neta Mends, MD  BIOTIN PO Take 600 mcg by mouth daily.    [provider]  Cholecalciferol (VITAMIN D PO) Take 2,000 Units by mouth daily.    [provider]  ferrous sulfate 325 (65 FE) MG tablet TAKE 1 TABLET BY MOUTH  WITH BREAKFAST    Panosh, Neta Mends, MD  phentermine (ADIPEX-P) 37.5 MG tablet Take 1 tablet (37.5 mg total) by mouth daily before breakfast. 06/15/23   Panosh, Neta Mends, MD  selenium 200 MCG TABS tablet 200 mcg daily. 08/29/22   [provider]      Allergies    Ciprofloxacin hcl, Peanut-containing drug products, and Percocet [oxycodone-acetaminophen]    Review of Systems   Review of Systems  Cardiovascular:  Positive for chest pain.    Physical Exam Updated Vital Signs BP 125/84 (BP Location: Right Arm)   Pulse 81   Temp 97.7 F (36.5 C) (Oral)   Resp 19   Ht 5\' 8"  (1.727 m)   Wt 130.2 kg   SpO2 99%   BMI 43.64 kg/m  Physical Exam Vitals and nursing note reviewed.  Constitutional:      General: She is not in acute distress.    Appearance: She is well-developed. She is obese.  HENT:     Head: Normocephalic and atraumatic.  Eyes:     Extraocular Movements: Extraocular movements intact.  Cardiovascular:     Rate and Rhythm: Normal rate and regular rhythm.     Pulses:          Radial pulses are 1+ on the right side and 1+ on the left side.  Dorsalis pedis pulses are 1+ on the right side and 1+ on the left side.     Heart sounds: Normal heart sounds.  Pulmonary:     Effort: Pulmonary effort is normal.     Breath sounds: Normal breath sounds.  Chest:     Chest wall: Tenderness (Mild left-sided chest wall tenderness) present.  Abdominal:     Palpations: Abdomen is soft.     Tenderness: There is no abdominal tenderness.  Musculoskeletal:        General: Normal range of motion.     Cervical back: Normal range of motion and neck supple.     Right lower leg: No edema.     Left lower leg: No edema.  Skin:    General: Skin is warm and dry.  Neurological:     General: No focal deficit present.     Mental Status: She is alert and oriented to person, place, and time.  Psychiatric:        Mood and Affect: Mood normal.        Behavior: Behavior normal.     ED  Results / Procedures / Treatments   Labs (all labs ordered are listed, but only abnormal results are displayed) Labs Reviewed  BASIC METABOLIC PANEL WITH GFR - Abnormal; Notable for the following components:      Result Value   Glucose, Bld 100 (*)    All other components within normal limits  CBC  HCG, QUANTITATIVE, PREGNANCY  TROPONIN I (HIGH SENSITIVITY)  TROPONIN I (HIGH SENSITIVITY)    EKG EKG Interpretation Date/Time:  Thursday July 15 2023 12:26:37 EDT Ventricular Rate:  81 PR Interval:  150 QRS Duration:  92 QT Interval:  399 QTC Calculation: 464 R Axis:   33  Text Interpretation: Sinus rhythm Low voltage, precordial leads Abnormal R-wave progression, early transition Confirmed by Lorre Nick ([id number]) on 07/15/2023 12:33:38 PM  Radiology DG Chest 2 View Result Date: 07/15/2023 CLINICAL DATA:  Chest pain EXAM: CHEST - 2 VIEW COMPARISON:  Chest x-ray 11/05/2009 FINDINGS: There is prominence of the right paratracheal region which is unchanged from prior examination. This may be in part due to rotation. This is unchanged. The cardiac silhouette is within normal limits. The lungs are clear. There is no pleural effusion or pneumothorax. No acute fractures are seen. IMPRESSION: 1. No active cardiopulmonary disease. 2. Stable prominence of the right paratracheal region, possibly rotational. Follow-up chest CT recommended when clinically appropriate. Electronically Signed   By: Darliss Cheney M.D.   On: 07/15/2023 14:57    Procedures Procedures    Medications Ordered in ED Medications - No data to display  ED Course/ Medical Decision Making/ A&P Clinical Course as of 07/15/23 1719  Thu Jul 15, 2023  1654 Repeat troponin is negative. HEART score is 2. Patient is stable for discharge home, will be recommended outpatient cardiology follow up. [VK]    Clinical Course User Index [VK] Rexford Maus, DO                                 Medical Decision Making This  patient presents to the ED with chief complaint(s) of chest pain with pertinent past medical history of hypertension, hypothyroidism status post thyroidectomy which further complicates the presenting complaint. The complaint involves an extensive differential diagnosis and also carries with it a high risk of complications and morbidity.    The differential diagnosis includes ACS, arrhythmia,  anemia, pneumonia, pneumothorax, pulmonary edema, pleural effusion, low risk for PE making this less likely, musculoskeletal pain, gastritis, GERD  Additional history obtained: Additional history obtained from N/A Records reviewed Primary Care Documents  ED Course and Reassessment: On patient's arrival she was initially hypertensive though normalized on repeat and otherwise hemodynamically stable in no acute distress.  EKG on arrival showed normal sinus rhythm without acute ischemic changes.  Triage note did report that her doctor told her that she had EKG changes but she states that she came straight to the ER today after the start of her chest pains unclear when this occurred and was having active chest pain when EKG was performed here.  She had labs initiated in triage however with her normal range, repeat troponin is pending.  She declined any pain control at this time and will be closely reassessed.  Independent labs interpretation:  The following labs were independently interpreted: Within normal range  Independent visualization of imaging: - I independently visualized the following imaging with scope of interpretation limited to determining acute life threatening conditions related to emergency care: Chest x-ray, which revealed no acute disease  Consultation: - Consulted or discussed management/test interpretation w/ external professional: N/A  Consideration for admission or further workup: Patient has no emergent conditions requiring admission or further work-up at this time and is stable for  discharge home with primary care and cardiology follow-up  Social Determinants of health: N/A    Amount and/or Complexity of Data Reviewed Labs: ordered. Radiology: ordered.          Final Clinical Impression(s) / ED Diagnoses Final diagnoses:  Nonspecific chest pain    Rx / DC Orders ED Discharge Orders          Ordered    Ambulatory referral to Cardiology       Comments: If you have not heard from the Cardiology office within the next 72 hours please call [phone].   07/15/23 1718              Nolberto Batty, DO 07/15/23 1719

## 2023-07-27 NOTE — Progress Notes (Unsigned)
 No chief complaint on file.   HPI: Misty Moran 58 y.o. come in for  ROS: See pertinent positives and negatives per HPI.  Past Medical History:  Diagnosis Date   Anemia    ? 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 to have surgery to remove   Swelling of right lower extremity hx of none currently   Thyroid  cancer (HCC)    Thyroid  disease    left side enlarged, biopsy done by dr Donalee Fruits 3 weeks ago, results normal   Transfusion history 03/30/2012   with partial bowel resection    Family History  Problem Relation Age of Onset   Hypertension Mother    Stroke Maternal Grandmother    Breast cancer Maternal Grandmother        great   Crohn's disease Maternal Grandmother        colitis   Colon cancer Maternal Uncle        great   Crohn's disease Maternal Aunt        colitis, diverticulosis   Protein S deficiency Maternal Aunt     Social History   Socioeconomic History   Marital status: Divorced    Spouse name: Not on file   Number of children: 2   Years of education: Not on file   Highest education level: 12th grade  Occupational History   Occupation: self employed  Tobacco Use   Smoking status: Former    Current packs/day: 0.00    Average packs/day: 1 pack/day for 9.0 years (9.0 ttl pk-yrs)    Types: Cigarettes    Start date: 03/30/1980    Quit date: 03/30/1989    Years since quitting: 34.3   Smokeless tobacco: Never  Vaping Use   Vaping status: Never Used  Substance and Sexual Activity   Alcohol use: Not Currently    Comment: rare   Drug use: No   Sexual activity: Not on file  Other Topics Concern   Not on file  Social History Narrative   Divorced   Right handed      Child at home    [facility] of 3   Regular exercise   G3p2   Some caretaking  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       Social Drivers of Research scientist (physical sciences) Strain: Low Risk  (04/15/2023)   Overall Financial Resource Strain (CARDIA)    Difficulty of Paying Living Expenses: Not very hard  Food Insecurity: No Food Insecurity (04/15/2023)   Hunger Vital Sign    Worried About Running Out of Food in the Last Year: Never true    Ran Out of Food in the Last Year: Never true  Transportation Needs: No Transportation Needs (04/15/2023)   PRAPARE - Administrator, Civil Service (Medical): No    Lack of Transportation (Non-Medical): No  Physical Activity: Unknown (04/15/2023)   Exercise Vital Sign    Days of Exercise per Week: 0 days    Minutes of Exercise per Session: Not on file  Stress: Stress Concern Present (04/15/2023)   [employer] of Occupational Health - Occupational Stress Questionnaire    Feeling of Stress : Very much  Social Connections: Socially Isolated (04/15/2023)   Social Connection and Isolation Panel [NHANES]  Frequency of Communication with Friends and Family: More than three times a week    Frequency of Social Gatherings with Friends and Family: Once a week    Attends Religious Services: Never    Database administrator or Organizations: No    Attends Engineer, structural: Not on file    Marital Status: Divorced    Outpatient Medications Prior to Visit  Medication Sig Dispense Refill   amLODipine  (NORVASC ) 5 MG tablet TAKE ONE TABLET BY MOUTH ONCE A DAY 90 tablet 0   BIOTIN PO Take 600 mcg by mouth daily.     Cholecalciferol (VITAMIN D PO) Take 2,000 Units by mouth daily.     ferrous sulfate  325 (65 FE) MG tablet TAKE 1 TABLET BY MOUTH WITH BREAKFAST 30 tablet 5   phentermine  (ADIPEX-P ) 37.5 MG tablet Take 1 tablet (37.5 mg total) by mouth daily before breakfast. 30 tablet 1   selenium 200 MCG TABS tablet 200 mcg daily.     No facility-administered medications prior to visit.     EXAM:  There were no vitals taken for this visit.  There is no height or weight on file to calculate  BMI.  GENERAL: vitals reviewed and listed above, alert, oriented, appears well hydrated and in no acute distress HEENT: atraumatic, conjunctiva  clear, no obvious abnormalities on inspection of external nose and ears OP : no lesion edema or exudate  NECK: no obvious masses on inspection palpation  LUNGS: clear to auscultation bilaterally, no wheezes, rales or rhonchi, good air movement CV: HRRR, no clubbing cyanosis or  peripheral edema nl cap refill  MS: moves all extremities without noticeable focal  abnormality PSYCH: pleasant and cooperative, no obvious depression or anxiety Lab Results  Component Value Date   WBC 5.4 07/15/2023   HGB 13.5 07/15/2023   HCT 41.9 07/15/2023   PLT 277 07/15/2023   GLUCOSE 100 (H) 07/15/2023   CHOL 250 (H) 04/15/2023   TRIG 116.0 04/15/2023   HDL 54.90 04/15/2023   LDLCALC 172 (H) 04/15/2023   ALT 18 04/15/2023   AST 13 04/15/2023   NA 138 07/15/2023   K 3.5 07/15/2023   CL 105 07/15/2023   CREATININE 0.66 07/15/2023   BUN 17 07/15/2023   CO2 24 07/15/2023   TSH 2.67 04/15/2023   HGBA1C 6.1 04/15/2023   MICROALBUR 3.6 (H) 06/20/2019   BP Readings from Last 3 Encounters:  07/15/23 (!) 141/97  06/15/23 128/86  04/15/23 110/88    ASSESSMENT AND PLAN:  Discussed the following assessment and plan:  No diagnosis found.  -Patient advised to return or notify health care team  if  new concerns arise.  There are no Patient Instructions on file for this visit.   Amirra Herling K. Leilah Polimeni M.D.

## 2023-07-28 ENCOUNTER — Encounter: Payer: Self-pay | Admitting: Internal Medicine

## 2023-07-28 ENCOUNTER — Ambulatory Visit: Admitting: Internal Medicine

## 2023-07-28 VITALS — BP 118/80 | HR 91 | Temp 98.0°F | Ht 68.0 in | Wt 289.4 lb

## 2023-07-28 DIAGNOSIS — E782 Mixed hyperlipidemia: Secondary | ICD-10-CM

## 2023-07-28 DIAGNOSIS — I10 Essential (primary) hypertension: Secondary | ICD-10-CM | POA: Diagnosis not present

## 2023-07-28 DIAGNOSIS — Z79899 Other long term (current) drug therapy: Secondary | ICD-10-CM

## 2023-07-28 DIAGNOSIS — R079 Chest pain, unspecified: Secondary | ICD-10-CM | POA: Diagnosis not present

## 2023-07-28 DIAGNOSIS — R9389 Abnormal findings on diagnostic imaging of other specified body structures: Secondary | ICD-10-CM

## 2023-07-28 NOTE — Patient Instructions (Signed)
 Exam is good    Keep  cards appt.  Could be chest wall pain but not  certain at this time  Plan  ordering chest ct   to evaluate. If  gets worse  fu evaluated in interim .

## 2023-07-29 ENCOUNTER — Ambulatory Visit (HOSPITAL_BASED_OUTPATIENT_CLINIC_OR_DEPARTMENT_OTHER)

## 2023-08-07 ENCOUNTER — Ambulatory Visit (HOSPITAL_BASED_OUTPATIENT_CLINIC_OR_DEPARTMENT_OTHER)
Admission: RE | Admit: 2023-08-07 | Discharge: 2023-08-07 | Disposition: A | Discharge status: Home / Self Care | Source: Ambulatory Visit | Attending: Internal Medicine | Admitting: Internal Medicine

## 2023-08-07 DIAGNOSIS — R9389 Abnormal findings on diagnostic imaging of other specified body structures: Secondary | ICD-10-CM | POA: Diagnosis not present

## 2023-08-07 DIAGNOSIS — R079 Chest pain, unspecified: Secondary | ICD-10-CM | POA: Diagnosis not present

## 2023-08-07 DIAGNOSIS — K760 Fatty (change of) liver, not elsewhere classified: Secondary | ICD-10-CM | POA: Diagnosis not present

## 2023-08-07 DIAGNOSIS — Q2545 Double aortic arch: Secondary | ICD-10-CM | POA: Diagnosis not present

## 2023-08-07 DIAGNOSIS — I3481 Nonrheumatic mitral (valve) annulus calcification: Secondary | ICD-10-CM | POA: Diagnosis not present

## 2023-08-07 MED ORDER — IOHEXOL 350 MG/ML SOLN
75.0000 mL | Freq: Once | INTRAVENOUS | Status: AC | PRN
Start: 2023-08-07 — End: 2023-08-07
  Administered 2023-08-07: 75 mL via INTRAVENOUS

## 2023-08-08 ENCOUNTER — Encounter: Payer: Self-pay | Admitting: Internal Medicine

## 2023-08-08 NOTE — Progress Notes (Signed)
 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 again not causing sx but important to optimize metabolic status over time  ( lifes style healthy eating and  exercise )  Will review and update at planned fu visit

## 2023-08-11 ENCOUNTER — Ambulatory Visit: Payer: Self-pay

## 2023-08-11 ENCOUNTER — Ambulatory Visit: Admitting: Cardiovascular Disease

## 2023-08-17 ENCOUNTER — Encounter: Payer: Self-pay | Admitting: Internal Medicine

## 2023-08-17 ENCOUNTER — Ambulatory Visit: Admitting: Internal Medicine

## 2023-08-17 VITALS — BP 114/84 | HR 84 | Temp 97.5°F | Ht 68.0 in | Wt 286.6 lb

## 2023-08-17 DIAGNOSIS — R079 Chest pain, unspecified: Secondary | ICD-10-CM

## 2023-08-17 DIAGNOSIS — I10 Essential (primary) hypertension: Secondary | ICD-10-CM

## 2023-08-17 DIAGNOSIS — Z79899 Other long term (current) drug therapy: Secondary | ICD-10-CM

## 2023-08-17 DIAGNOSIS — E785 Hyperlipidemia, unspecified: Secondary | ICD-10-CM | POA: Insufficient documentation

## 2023-08-17 DIAGNOSIS — Q2545 Double aortic arch: Secondary | ICD-10-CM | POA: Insufficient documentation

## 2023-08-17 NOTE — Progress Notes (Signed)
 Chief Complaint  Patient presents with   Medical Management of Chronic Issues    Pt is here for a f/u. Pt reports she still notice slight chest pain on L. Pain level 1-2. It is consistent but not bad as it was.     HPI: Misty Moran 57 y.o. come in for fu atypical left chest pain that led her to ed visit .  She finally has a cardiology appt  in June with dr [place].  Since last visit her pain is less still continuing and no new sx  Planning on trying to implement better lfie style eating . Had chst ct and lab fu  ROS: See pertinent positives and negatives per HPI.  Past Medical History:  Diagnosis Date   Anemia    ? 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 to have surgery to remove   Swelling of right lower extremity hx of none currently   Thyroid  cancer (HCC)    Thyroid  disease    left side enlarged, biopsy done by dr Donalee Fruits 3 weeks ago, results normal   Transfusion history 03/30/2012   with partial bowel resection    Family History  Problem Relation Age of Onset   Hypertension Mother    Stroke Maternal Grandmother    Breast cancer Maternal Grandmother        great   Crohn's disease Maternal Grandmother        colitis   Colon cancer Maternal Uncle        great   Crohn's disease Maternal Aunt        colitis, diverticulosis   Protein S deficiency Maternal Aunt     Social History   Socioeconomic History   Marital status: Divorced    Spouse name: Not on file   Number of children: 2   Years of education: Not on file   Highest education level: 12th grade  Occupational History   Occupation: self employed  Tobacco Use   Smoking status: Former    Current packs/day: 0.00    Average packs/day: 1 pack/day for 9.0 years (9.0 ttl pk-yrs)    Types: Cigarettes    Start date: 03/30/1980    Quit date: 03/30/1989    Years since quitting: 34.4   Smokeless  tobacco: Never  Vaping Use   Vaping status: Never Used  Substance and Sexual Activity   Alcohol use: Not Currently    Comment: rare   Drug use: No   Sexual activity: Not on file  Other Topics Concern   Not on file  Social History Narrative   Divorced   Right handed      Child at home    [facility] of 3   Regular exercise   G3p2   Some caretaking  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       Social Drivers of Corporate investment banker Strain: Low Risk  (04/15/2023)   Overall Financial Resource Strain (CARDIA)    Difficulty of Paying Living Expenses: Not very hard  Food Insecurity: No Food Insecurity (04/15/2023)   Hunger Vital Sign    Worried About Running Out of Food in the Last Year: Never true    Ran Out of Food in the Last Year: Never true  Transportation  Needs: No Transportation Needs (04/15/2023)   PRAPARE - Administrator, Civil Service (Medical): No    Lack of Transportation (Non-Medical): No  Physical Activity: Unknown (04/15/2023)   Exercise Vital Sign    Days of Exercise per Week: 0 days    Minutes of Exercise per Session: Not on file  Stress: Stress Concern Present (04/15/2023)   [employer] of Occupational Health - Occupational Stress Questionnaire    Feeling of Stress : Very much  Social Connections: Socially Isolated (04/15/2023)   Social Connection and Isolation Panel [NHANES]    Frequency of Communication with Friends and Family: More than three times a week    Frequency of Social Gatherings with Friends and Family: Once a week    Attends Religious Services: Never    Database administrator or Organizations: No    Attends Engineer, structural: Not on file    Marital Status: Divorced    Outpatient Medications Prior to Visit  Medication Sig Dispense Refill   amLODipine  (NORVASC ) 5 MG tablet TAKE ONE TABLET BY MOUTH ONCE A DAY 90 tablet 0   BIOTIN PO Take 600 mcg by mouth daily.     Cholecalciferol (VITAMIN D PO) Take  2,000 Units by mouth daily.     Cyanocobalamin  (VITAMIN B12 PO) Take by mouth.     ferrous sulfate  325 (65 FE) MG tablet TAKE 1 TABLET BY MOUTH WITH BREAKFAST 30 tablet 5   phentermine  (ADIPEX-P ) 37.5 MG tablet Take 1 tablet (37.5 mg total) by mouth daily before breakfast. 30 tablet 1   selenium 200 MCG TABS tablet 200 mcg daily.     No facility-administered medications prior to visit.     EXAM:  BP 114/84 (BP Location: Left Arm, Patient Position: Sitting, Cuff Size: Large)   Pulse 84   Temp (!) 97.5 F (36.4 C) (Oral)   Ht 5\' 8"  (1.727 m)   Wt 286 lb 9.6 oz (130 kg)   SpO2 94%   BMI 43.58 kg/m   Body mass index is 43.58 kg/m.  GENERAL: vitals reviewed and listed above, alert, oriented, appears well hydrated and in no acute distress HEENT: atraumatic, conjunctiva  clear, no obvious abnormalities on inspection of external nose and earsNECK: no obvious masses on inspection palpation  LUNGS: clear to auscultation bilaterally, no wheezes, rales or rhonchi, good air movement CV: HRRR, no clubbing cyanosis or  peripheral edema nl cap refill  no murmur heard   MS: moves all extremities without noticeable focal  abnormality PSYCH: pleasant and cooperative, no obvious depression or anxiety Lab Results  Component Value Date   WBC 5.4 07/15/2023   HGB 13.5 07/15/2023   HCT 41.9 07/15/2023   PLT 277 07/15/2023   GLUCOSE 100 (H) 07/15/2023   CHOL 250 (H) 04/15/2023   TRIG 116.0 04/15/2023   HDL 54.90 04/15/2023   LDLCALC 172 (H) 04/15/2023   ALT 18 04/15/2023   AST 13 04/15/2023   NA 138 07/15/2023   K 3.5 07/15/2023   CL 105 07/15/2023   CREATININE 0.66 07/15/2023   BUN 17 07/15/2023   CO2 24 07/15/2023   TSH 2.67 04/15/2023   HGBA1C 6.1 04/15/2023   MICROALBUR 3.6 (H) 06/20/2019   BP Readings from Last 3 Encounters:  08/17/23 114/84  07/28/23 118/80  07/15/23 (!) 141/97  IMPRESSION: 1. No evidence of pulmonary embolism or other acute findings in the chest. 2. 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 Great vessels emanate from the anterior left aortic arch. The double aortic arch envelop the trachea and esophagus but does not cause visible significant tracheomalacia or significant compression. However, vascular rings can be symptomatic. 3. Diffuse hepatic steatosis.     Electronically Signed   By: Erica Hau M.D.   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 Uncertain if any sx at this time  Hld  poss masld  reviewed risk  and importance of addressing  Plans Intensify lifestyle interventions.  And fu in 3-4 mos   check A1c then and  other as appropriate.  Cards also can opine on lipids   Bp at goal today. Atypical chest pain is better uncertain how related to above.  -Patient advised to return or notify health care team  if  new concerns arise.  Patient Instructions  Cholesterol needs to come down   We can make arrangements for fu labs   and also  but can wait until after cardiology visit.  Bp is good today . I will send info to Dr Swaz[place] who will see  you  at  your cards appt.     Sariya Trickey K. Mykah Shin M.D.

## 2023-08-17 NOTE — Patient Instructions (Addendum)
 Cholesterol needs to come down   We can make arrangements for fu labs   and also  but can wait until after cardiology visit.  Bp is good today . I will send info to Dr [place] who will see  you  at  your cards appt.

## 2023-08-31 ENCOUNTER — Other Ambulatory Visit: Payer: Self-pay | Admitting: Internal Medicine

## 2023-09-09 NOTE — Progress Notes (Signed)
 Cardiology Office Note:    Date:  09/13/2023   ID:  Misty Moran, DOB [birth year], MRN [id number]  PCP:  Reginal Capra, MD   [facility] Providers Cardiologist:  None     Referring MD: Nolberto Batty, DO   Chief Complaint  Patient presents with   Chest Pain    History of Present Illness:    Misty Moran is a 58 y.o. female seen at the request of Dr Ethel Henry for evaluation of chest pain. She has a history of HTN. She was seen in the ED on April 17 for acute chest pain. She states pain started around her breast area then radiated up into her neck. It was 10/10. She thought she was having an MI. No clear exacerbating factors. In ED Ecg showed no acute ST-T changes. CXR was negative. Troponin normal x 2. She states that gradually the chest pain has improved but is still there 1/10 and constant. She did have a CT chest last month. This showed anatomic variant double aortic arch with left arch giving off left sided great vessels. There was no significant compression of the esophagus or trachia noted. No PE. No coronary calcification but some aortic calcification noted.   Past Medical History:  Diagnosis Date   Anemia    ? 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 to have surgery to remove   Swelling of right lower extremity hx of none currently   Thyroid  cancer (HCC)    Thyroid  disease    left side enlarged, biopsy done by dr Donalee Fruits 3 weeks ago, results normal   Transfusion history 03/30/2012   with partial bowel resection    Past Surgical History:  Procedure Laterality Date   BIOPSY THYROID  Left 03/30/2012   Medial benign follicular lesion left   BREAST REDUCTION SURGERY Bilateral 03/30/2010   CESAREAN SECTION  1987 and 2002   CHOLECYSTECTOMY  03/30/1985   COSMETIC SURGERY     tummy tuck breast reduction  lipossuction   PARTIAL COLECTOMY N/A  11/18/2012   Procedure: OPEN SIGMOID COLECTOMY, splenic flexure mobilization, rigid protoscope;  Surgeon: Levert Ready, MD;  Location: [facility];  Service: General;  Laterality: N/A;   REDUCTION MAMMAPLASTY Bilateral    removal of renal lesion benign   03/30/2009   THYROIDECTOMY Right 03/30/1997   thyroid  cancer   tummy tuck  03/30/2010   UMBILICAL HERNIA REPAIR N/A 11/27/2021   Procedure: HERNIA REPAIR UMBILICAL ADULT WITH MESH;  Surgeon: Oza Blumenthal, MD;  Location: [facility];  Service: General;  Laterality: N/A;    Current Medications: Current Meds  Medication Sig   amLODipine  (NORVASC ) 5 MG tablet TAKE ONE TABLET BY MOUTH ONCE A DAY   BIOTIN PO Take 600 mcg by mouth daily.   Cholecalciferol (VITAMIN D PO) Take 2,000 Units by mouth daily.   Cyanocobalamin  (VITAMIN B12 PO) Take by mouth.   ferrous sulfate  325 (65 FE) MG tablet TAKE 1 TABLET BY MOUTH WITH BREAKFAST   phentermine  (ADIPEX-P ) 37.5 MG tablet TAKE ONE TABLET BY MOUTH DAILY BEFORE BREAKFAST   rosuvastatin (CRESTOR) 10 MG tablet Take 1 tablet (10 mg total) by mouth daily.   selenium 200 MCG TABS tablet 200 mcg daily.     Allergies:   Ciprofloxacin hcl, Peanut-containing drug products, and Percocet [oxycodone -acetaminophen ]   Social  History   Socioeconomic History   Marital status: Divorced    Spouse name: Not on file   Number of children: 2   Years of education: Not on file   Highest education level: 12th grade  Occupational History   Occupation: self employed  Tobacco Use   Smoking status: Former    Current packs/day: 0.00    Average packs/day: 1 pack/day for 9.0 years (9.0 ttl pk-yrs)    Types: Cigarettes    Start date: 03/30/1980    Quit date: 03/30/1989    Years since quitting: 34.4   Smokeless tobacco: Never  Vaping Use   Vaping status: Never Used  Substance and Sexual Activity   Alcohol use: Not Currently    Comment: rare   Drug use: No   Sexual activity: Not on file  Other Topics Concern   Not on  file  Social History Narrative   Divorced   Right handed      Child at home    [facility] of 3   Regular exercise   G3p2   Some caretaking  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       Social Drivers of Corporate investment banker Strain: Low Risk  (04/15/2023)   Overall Financial Resource Strain (CARDIA)    Difficulty of Paying Living Expenses: Not very hard  Food Insecurity: No Food Insecurity (04/15/2023)   Hunger Vital Sign    Worried About Running Out of Food in the Last Year: Never true    Ran Out of Food in the Last Year: Never true  Transportation Needs: No Transportation Needs (04/15/2023)   PRAPARE - Administrator, Civil Service (Medical): No    Lack of Transportation (Non-Medical): No  Physical Activity: Unknown (04/15/2023)   Exercise Vital Sign    Days of Exercise per Week: 0 days    Minutes of Exercise per Session: Not on file  Stress: Stress Concern Present (04/15/2023)   [employer] of Occupational Health - Occupational Stress Questionnaire    Feeling of Stress : Very much  Social Connections: Socially Isolated (04/15/2023)   Social Connection and Isolation Panel    Frequency of Communication with Friends and Family: More than three times a week    Frequency of Social Gatherings with Friends and Family: Once a week    Attends Religious Services: Never    Database administrator or Organizations: No    Attends Engineer, structural: Not on file    Marital Status: Divorced     Family History: The patient's family history includes Breast cancer in her maternal grandmother; Colon cancer in her maternal uncle; Crohn's disease in her maternal aunt and maternal grandmother; Hypertension in her mother; Protein S deficiency in her maternal aunt; Stroke in her maternal grandmother.  ROS:   Please see the history of present illness.     All other systems reviewed and are negative.  EKGs/Labs/Other Studies Reviewed:    The following  studies were reviewed today: EKG Interpretation Date/Time:  Monday September 13 2023 14:19:59 EDT Ventricular Rate:  86 PR Interval:  150 QRS Duration:  78 QT Interval:  378 QTC Calculation: 452 R Axis:   28  Text Interpretation: Normal sinus rhythm Nonspecific T wave abnormality When compared with ECG of 15-Jul-2023 12:26, No significant change was found Confirmed by [place], Ali Mclaurin [phone]) on 09/13/2023 2:27:20 PM   EKG Interpretation Date/Time:  Monday September 13 2023 14:19:59 EDT Ventricular Rate:  86 PR Interval:  150 QRS Duration:  78 QT Interval:  378 QTC Calculation: 452 R Axis:   28  Text Interpretation: Normal sinus rhythm Nonspecific T wave abnormality When compared with ECG of 15-Jul-2023 12:26, No significant change was found Confirmed by [place], Erlinda Solinger [phone]) on 09/13/2023 2:27:20 PM    Recent Labs: 04/15/2023: ALT 18; TSH 2.67 07/15/2023: BUN 17; Creatinine, Ser 0.66; Hemoglobin 13.5; Platelets 277; Potassium 3.5; Sodium 138  Recent Lipid Panel    Component Value Date/Time   CHOL 250 (H) 04/15/2023 1100   TRIG 116.0 04/15/2023 1100   HDL 54.90 04/15/2023 1100   CHOLHDL 5 04/15/2023 1100   VLDL 23.2 04/15/2023 1100   LDLCALC 172 (H) 04/15/2023 1100     Risk Assessment/Calculations:                Physical Exam:    VS:  BP 126/80 (BP Location: Left Arm, Patient Position: Sitting, Cuff Size: Large)   Pulse 86   Ht 5' 7 (1.702 m)   Wt 285 lb 9.6 oz (129.5 kg)   SpO2 95%   BMI 44.73 kg/m     Wt Readings from Last 3 Encounters:  09/13/23 285 lb 9.6 oz (129.5 kg)  08/17/23 286 lb 9.6 oz (130 kg)  07/28/23 289 lb 6.4 oz (131.3 kg)     GEN:  Well nourished, obese in no acute distress HEENT: Normal NECK: No JVD; No carotid bruits LYMPHATICS: No lymphadenopathy CARDIAC: RRR, no murmurs, rubs, gallops RESPIRATORY:  Clear to auscultation without rales, wheezing or rhonchi  ABDOMEN: Soft, non-tender, non-distended MUSCULOSKELETAL:  No edema; No deformity   SKIN: Warm and dry NEUROLOGIC:  Alert and oriented x 3 PSYCHIATRIC:  Normal affect   ASSESSMENT:    1. Chest pain, unspecified type   2. 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 Aortic atherosclerosis (HCC)   5. Hypercholesteremia    PLAN:    In order of problems listed above:  Chest pain with atypical features. She had severe chest pain initially with gradual resolution over a 2 month period. Evaluation in ED unremarkable. Doubt ischemic in etiology given character of pain, normal Ecg and lack of coronary calcification (some Mitral annular calcification noted). Since pain almost resolved at this point I would not pursue further evaluation. If pain should worsen I would consider a coronary CTA which would also image her aorta.  HTN controlled on amlodipine  Aortic atherosclerosis.  Double aortic arch- variant. While there is a vascular ring no evidence of compression of esophagus or trachea. Hypercholesterolemia. Given #3 should be on statin therapy with goal LDL < 70. Misty start Crestor 10 mg daily. Follow up labs with Dr Ethel Henry.            Medication Adjustments/Labs and Tests Ordered: Current medicines are reviewed at length with the patient today.  Concerns regarding medicines are outlined above.  Orders Placed This Encounter  Procedures   EKG 12-Lead   Meds ordered this encounter  Medications   rosuvastatin (CRESTOR) 10 MG tablet    Sig: Take 1 tablet (10 mg total) by mouth daily.    Dispense:  90 tablet    Refill:  3    Patient Instructions  Medication Instructions:  Start Crestor 10 mg daily  Continue all other medications  Lab Work: None ordered  Testing/Procedures: None ordered  Follow-Up: At [facility], you and your health needs are our priority.  As part of our continuing mission to provide you with exceptional heart care, our providers are all part of one team.  This team includes your primary Cardiologist  (physician) and Advanced Practice Providers or APPs (Physician Assistants and Nurse Practitioners) who all work together to provide you with the care you need, when you need it.  Your next appointment:  As Needed    Provider:  Dr.Evangelyn Crouse   We recommend signing up for the patient portal called MyChart.  Sign up information is provided on this After Visit Summary.  MyChart is used to connect with patients for Virtual Visits (Telemedicine).  Patients are able to view lab/test results, encounter notes, upcoming appointments, etc.  Non-urgent messages can be sent to your provider as well.   To learn more about what you can do with MyChart, go to [email or web address].         Signed, Akua Blethen [place], MD  09/13/2023 2:50 PM    [facility]

## 2023-09-13 ENCOUNTER — Encounter: Payer: Self-pay | Admitting: Cardiology

## 2023-09-13 ENCOUNTER — Ambulatory Visit: Attending: Cardiology | Admitting: Cardiology

## 2023-09-13 VITALS — BP 126/80 | HR 86 | Ht 67.0 in | Wt 285.6 lb

## 2023-09-13 DIAGNOSIS — I700 Atherosclerosis of aorta: Secondary | ICD-10-CM | POA: Diagnosis not present

## 2023-09-13 DIAGNOSIS — I10 Essential (primary) hypertension: Secondary | ICD-10-CM | POA: Diagnosis not present

## 2023-09-13 DIAGNOSIS — E7800 Pure hypercholesterolemia, unspecified: Secondary | ICD-10-CM

## 2023-09-13 DIAGNOSIS — R079 Chest pain, unspecified: Secondary | ICD-10-CM

## 2023-09-13 DIAGNOSIS — Q2545 Double aortic arch: Secondary | ICD-10-CM

## 2023-09-13 MED ORDER — ROSUVASTATIN CALCIUM 10 MG PO TABS: 10.0000 mg | ORAL_TABLET | Freq: Every day | ORAL | 3 refills | Status: AC

## 2023-09-13 NOTE — Patient Instructions (Signed)
 Medication Instructions:  Start Crestor 10 mg daily  Continue all other medications  Lab Work: None ordered  Testing/Procedures: None ordered  Follow-Up: At [facility], you and your health needs are our priority.  As part of our continuing mission to provide you with exceptional heart care, our providers are all part of one team.  This team includes your primary Cardiologist (physician) and Advanced Practice Providers or APPs (Physician Assistants and Nurse Practitioners) who all work together to provide you with the care you need, when you need it.  Your next appointment:  As Needed    Provider:  Dr.Jordan   We recommend signing up for the patient portal called MyChart.  Sign up information is provided on this After Visit Summary.  MyChart is used to connect with patients for Virtual Visits (Telemedicine).  Patients are able to view lab/test results, encounter notes, upcoming appointments, etc.  Non-urgent messages can be sent to your provider as well.   To learn more about what you can do with MyChart, go to [email or web address].

## 2023-10-13 ENCOUNTER — Other Ambulatory Visit: Payer: Self-pay | Admitting: Internal Medicine

## 2023-10-15 ENCOUNTER — Other Ambulatory Visit: Payer: Self-pay | Admitting: Family

## 2023-11-17 ENCOUNTER — Ambulatory Visit: Admitting: Internal Medicine

## 2023-12-08 ENCOUNTER — Ambulatory Visit: Admitting: Internal Medicine

## 2024-01-13 ENCOUNTER — Other Ambulatory Visit: Payer: Self-pay | Admitting: Internal Medicine

## 2024-04-16 ENCOUNTER — Other Ambulatory Visit: Payer: Self-pay | Admitting: Internal Medicine
